# Patient Record
Sex: Male | Born: 1960 | Race: White | Hispanic: No | Marital: Married | Smoking: Never smoker
Health system: Southern US, Community
[De-identification: ages and names within clinical notes are randomized; demographics above are authoritative.]

## PROBLEM LIST (undated history)

## (undated) DIAGNOSIS — T7840XA Allergy, unspecified, initial encounter: Secondary | ICD-10-CM

## (undated) DIAGNOSIS — R0602 Shortness of breath: Secondary | ICD-10-CM

## (undated) DIAGNOSIS — K219 Gastro-esophageal reflux disease without esophagitis: Secondary | ICD-10-CM

## (undated) DIAGNOSIS — Z86010 Personal history of colonic polyps: Principal | ICD-10-CM

## (undated) DIAGNOSIS — M1990 Unspecified osteoarthritis, unspecified site: Secondary | ICD-10-CM

## (undated) DIAGNOSIS — I10 Essential (primary) hypertension: Secondary | ICD-10-CM

## (undated) DIAGNOSIS — J069 Acute upper respiratory infection, unspecified: Secondary | ICD-10-CM

## (undated) DIAGNOSIS — J302 Other seasonal allergic rhinitis: Secondary | ICD-10-CM

## (undated) HISTORY — DX: Essential (primary) hypertension: I10

## (undated) HISTORY — DX: Personal history of colonic polyps: Z86.010

## (undated) HISTORY — DX: Allergy, unspecified, initial encounter: T78.40XA

## (undated) HISTORY — DX: Gastro-esophageal reflux disease without esophagitis: K21.9

## (undated) HISTORY — PX: TONSILLECTOMY: SUR1361

## (undated) HISTORY — PX: OTHER SURGICAL HISTORY: SHX169

## (undated) HISTORY — DX: Shortness of breath: R06.02

---
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
## 2005-04-08 ENCOUNTER — Encounter: Admission: RE | Admit: 2005-04-08 | Discharge: 2005-07-07 | Payer: Self-pay | Admitting: Family Medicine

## 2005-09-22 ENCOUNTER — Encounter: Admission: RE | Admit: 2005-09-22 | Discharge: 2005-12-21 | Payer: Self-pay | Admitting: Family Medicine

## 2007-09-09 HISTORY — PX: KNEE ARTHROTOMY: SUR107

## 2010-08-19 ENCOUNTER — Encounter
Admission: RE | Admit: 2010-08-19 | Discharge: 2010-09-07 | Payer: Self-pay | Source: Home / Self Care | Attending: Sports Medicine | Admitting: Sports Medicine

## 2010-09-08 ENCOUNTER — Encounter
Admission: RE | Admit: 2010-09-08 | Discharge: 2010-10-08 | Payer: Self-pay | Source: Home / Self Care | Attending: Sports Medicine | Admitting: Sports Medicine

## 2011-10-21 ENCOUNTER — Other Ambulatory Visit (HOSPITAL_COMMUNITY): Payer: Self-pay | Admitting: Orthopaedic Surgery

## 2011-11-18 ENCOUNTER — Encounter (HOSPITAL_COMMUNITY): Payer: Self-pay | Admitting: Pharmacy Technician

## 2011-11-20 ENCOUNTER — Encounter (HOSPITAL_COMMUNITY)
Admission: RE | Admit: 2011-11-20 | Discharge: 2011-11-20 | Disposition: A | Discharge status: Home / Self Care | Payer: BC Managed Care – PPO | Source: Ambulatory Visit | Attending: Orthopaedic Surgery | Admitting: Orthopaedic Surgery

## 2011-11-20 ENCOUNTER — Encounter (HOSPITAL_COMMUNITY): Payer: Self-pay

## 2011-11-20 ENCOUNTER — Other Ambulatory Visit: Payer: Self-pay

## 2011-11-20 ENCOUNTER — Ambulatory Visit (HOSPITAL_COMMUNITY)
Admission: RE | Admit: 2011-11-20 | Discharge: 2011-11-20 | Disposition: A | Discharge status: Home / Self Care | Payer: BC Managed Care – PPO | Source: Ambulatory Visit | Attending: Orthopaedic Surgery | Admitting: Orthopaedic Surgery

## 2011-11-20 DIAGNOSIS — Z01810 Encounter for preprocedural cardiovascular examination: Secondary | ICD-10-CM | POA: Insufficient documentation

## 2011-11-20 DIAGNOSIS — I10 Essential (primary) hypertension: Secondary | ICD-10-CM | POA: Insufficient documentation

## 2011-11-20 DIAGNOSIS — Z01818 Encounter for other preprocedural examination: Secondary | ICD-10-CM | POA: Insufficient documentation

## 2011-11-20 DIAGNOSIS — Z01812 Encounter for preprocedural laboratory examination: Secondary | ICD-10-CM | POA: Insufficient documentation

## 2011-11-20 DIAGNOSIS — E119 Type 2 diabetes mellitus without complications: Secondary | ICD-10-CM | POA: Insufficient documentation

## 2011-11-20 HISTORY — DX: Acute upper respiratory infection, unspecified: J06.9

## 2011-11-20 HISTORY — DX: Other seasonal allergic rhinitis: J30.2

## 2011-11-20 HISTORY — DX: Unspecified osteoarthritis, unspecified site: M19.90

## 2011-11-20 LAB — URINALYSIS, ROUTINE W REFLEX MICROSCOPIC
Bilirubin Urine: NEGATIVE
Ketones, ur: NEGATIVE mg/dL
Nitrite: NEGATIVE
Specific Gravity, Urine: 1.028 (ref 1.005–1.030)
Urobilinogen, UA: 0.2 mg/dL (ref 0.0–1.0)

## 2011-11-20 LAB — CBC
MCV: 89 fL (ref 78.0–100.0)
Platelets: 245 10*3/uL (ref 150–400)
RDW: 12.1 % (ref 11.5–15.5)
WBC: 7.3 10*3/uL (ref 4.0–10.5)

## 2011-11-20 LAB — BASIC METABOLIC PANEL
CO2: 27 mEq/L (ref 19–32)
Calcium: 9.5 mg/dL (ref 8.4–10.5)
Creatinine, Ser: 0.61 mg/dL (ref 0.50–1.35)
GFR calc Af Amer: >90 mL/min (ref >90)

## 2011-11-20 LAB — SURGICAL PCR SCREEN: Staphylococcus aureus: NEGATIVE

## 2011-11-20 LAB — URINE MICROSCOPIC-ADD ON

## 2011-11-20 NOTE — Pre-Procedure Instructions (Signed)
Spoke with [place] at Dr Vevelyn Royals office who stated she would send intra office email to Dr Vevelyn Royals assistant to review abnormal urinalysis and glucose

## 2011-11-20 NOTE — Patient Instructions (Signed)
20 DORANCE SPINK  11/20/2011   Your procedure is scheduled on:  11/28/11   Surgery 0730-0900     FRIDAY  Report to [facility] at  0515     AM.  Call this number if you have problems the morning of surgery: [phone]     Or PST   [id number]  [facility]   Remember:   Do not eat food:After Midnight.    Thursday night  May have clear liquids:  Until MIDNIGHT  Thursday night  Clear liquids include soda, tea, black coffee, apple or grape juice, broth.  Take these medicines the morning of surgery with A SIP OF WATER:none   Do not wear jewelry, make-up or nail polish.  Do not wear lotions, powders, or perfumes. You may wear deodorant.  Do not shave 48 hours prior to surgery.  Do not bring valuables to the hospital.  Contacts, dentures or bridgework may not be worn into surgery.  Leave suitcase in the car. After surgery it may be brought to your room.  For patients admitted to the hospital, checkout time is 11:00 AM the day of discharge.   Patients discharged the day of surgery will not be allowed to drive home.  Name and phone number of your driver:       wife                                                               Special Instructions: CHG Shower Use Special Wash: 1/2 bottle night before surgery and 1/2 bottle morning of surgery. REGULAR SOAP FACE  And PRIVATES            MEN-MAY SHAVE FACE MORNING OF SURGERY  Please read over the following fact sheets that you were given: MRSA Information

## 2011-11-20 NOTE — Pre-Procedure Instructions (Signed)
fAXED  STOP BANG SCREENING TOOL TO DR Rosezetta Schlatter.  States doesn't desire information packet regarding total joint replacements.  States BP has not been elevated as is today- states will recheck at home and see PCP if remains elevated, denies headache

## 2011-11-20 NOTE — Progress Notes (Signed)
11/20/11 0904  OBSTRUCTIVE SLEEP APNEA  Have you ever been diagnosed with sleep apnea through a sleep study? No  Do you snore loudly (loud enough to be heard through closed doors)?  1  Do you often feel tired, fatigued, or sleepy during the daytime? 0  Has anyone observed you stop breathing during your sleep? 0  Do you have, or are you being treated for high blood pressure? 1  BMI more than 35 kg/m2? 0  Age over 51 years old? 1  Neck circumference greater than 40 cm/18 inches? 0  Gender: 1  Obstructive Sleep Apnea Score 4   Score 4 or greater  Updated health history;Results sent to PCP

## 2011-11-21 ENCOUNTER — Other Ambulatory Visit (HOSPITAL_COMMUNITY): Payer: BC Managed Care – PPO

## 2011-11-28 ENCOUNTER — Ambulatory Visit (HOSPITAL_COMMUNITY): Payer: BC Managed Care – PPO

## 2011-11-28 ENCOUNTER — Inpatient Hospital Stay (HOSPITAL_COMMUNITY)
Admission: RE | Admit: 2011-11-28 | Discharge: 2011-12-01 | DRG: 818 | Disposition: A | Discharge status: Home Health | Payer: BC Managed Care – PPO | Source: Ambulatory Visit | Attending: Orthopaedic Surgery | Admitting: Orthopaedic Surgery

## 2011-11-28 ENCOUNTER — Ambulatory Visit (HOSPITAL_COMMUNITY): Payer: BC Managed Care – PPO | Admitting: Anesthesiology

## 2011-11-28 ENCOUNTER — Encounter (HOSPITAL_COMMUNITY): Payer: Self-pay | Admitting: *Deleted

## 2011-11-28 ENCOUNTER — Encounter (HOSPITAL_COMMUNITY): Payer: Self-pay | Admitting: Anesthesiology

## 2011-11-28 ENCOUNTER — Encounter (HOSPITAL_COMMUNITY)
Admission: RE | Disposition: A | Discharge status: Home Health | Payer: Self-pay | Source: Ambulatory Visit | Attending: Orthopaedic Surgery

## 2011-11-28 DIAGNOSIS — G4730 Sleep apnea, unspecified: Secondary | ICD-10-CM | POA: Diagnosis present

## 2011-11-28 DIAGNOSIS — E119 Type 2 diabetes mellitus without complications: Secondary | ICD-10-CM | POA: Diagnosis present

## 2011-11-28 DIAGNOSIS — M169 Osteoarthritis of hip, unspecified: Secondary | ICD-10-CM

## 2011-11-28 DIAGNOSIS — I10 Essential (primary) hypertension: Secondary | ICD-10-CM | POA: Diagnosis present

## 2011-11-28 DIAGNOSIS — M1610 Unilateral primary osteoarthritis, unspecified hip: Principal | ICD-10-CM | POA: Diagnosis present

## 2011-11-28 DIAGNOSIS — Z96642 Presence of left artificial hip joint: Secondary | ICD-10-CM

## 2011-11-28 HISTORY — PX: TOTAL HIP ARTHROPLASTY: SHX124

## 2011-11-28 LAB — GLUCOSE, CAPILLARY
Glucose-Capillary: 228 mg/dL — ABNORMAL HIGH (ref 70–99)
Glucose-Capillary: 447 mg/dL — ABNORMAL HIGH (ref 70–99)
Glucose-Capillary: 302 mg/dL — ABNORMAL HIGH (ref 70–99)

## 2011-11-28 LAB — TYPE AND SCREEN
ABO/RH(D): A NEG
Antibody Screen: NEGATIVE

## 2011-11-28 SURGERY — ARTHROPLASTY, HIP, TOTAL, ANTERIOR APPROACH
Anesthesia: General | Site: Hip | Laterality: Left | Wound class: Clean

## 2011-11-28 MED ORDER — INSULIN ASPART 100 UNIT/ML ~~LOC~~ SOLN
0.0000 [IU] | SUBCUTANEOUS | Status: DC
  Administered 2011-11-28: 8 [IU] via SUBCUTANEOUS
  Administered 2011-11-28: 20 [IU] via SUBCUTANEOUS

## 2011-11-28 MED ORDER — INSULIN ASPART 100 UNIT/ML ~~LOC~~ SOLN
0.0000 [IU] | Freq: Three times a day (TID) | SUBCUTANEOUS | Status: DC
  Administered 2011-11-29: 7 [IU] via SUBCUTANEOUS
  Administered 2011-11-29: 11 [IU] via SUBCUTANEOUS
  Administered 2011-11-29: 7 [IU] via SUBCUTANEOUS
  Administered 2011-11-30: 20 [IU] via SUBCUTANEOUS
  Administered 2011-11-30: 7 [IU] via SUBCUTANEOUS
  Administered 2011-11-30 – 2011-12-01 (×2): 4 [IU] via SUBCUTANEOUS

## 2011-11-28 MED ORDER — CEFAZOLIN SODIUM-DEXTROSE 2-3 GM-% IV SOLR: 2.0000 g | INTRAVENOUS | Status: DC

## 2011-11-28 MED ORDER — ROCURONIUM BROMIDE 100 MG/10ML IV SOLN
INTRAVENOUS | Status: DC | PRN
  Administered 2011-11-28: 10 mg via INTRAVENOUS
  Administered 2011-11-28: 30 mg via INTRAVENOUS
  Administered 2011-11-28: 10 mg via INTRAVENOUS
  Administered 2011-11-28: 50 mg via INTRAVENOUS
  Administered 2011-11-28: 10 mg via INTRAVENOUS

## 2011-11-28 MED ORDER — NALOXONE HCL 0.4 MG/ML IJ SOLN: 0.4000 mg | INTRAMUSCULAR | Status: DC | PRN

## 2011-11-28 MED ORDER — METHOCARBAMOL 500 MG PO TABS
500.0000 mg | ORAL_TABLET | Freq: Four times a day (QID) | ORAL | Status: DC | PRN
  Administered 2011-11-29 – 2011-12-01 (×6): 500 mg via ORAL
  Filled 2011-11-28 (×7): qty 1

## 2011-11-28 MED ORDER — INSULIN ASPART 100 UNIT/ML ~~LOC~~ SOLN
SUBCUTANEOUS | Status: AC
  Filled 2011-11-28: qty 3

## 2011-11-28 MED ORDER — HYDROCODONE-ACETAMINOPHEN 5-325 MG PO TABS: 1.0000 | ORAL_TABLET | ORAL | Status: DC | PRN

## 2011-11-28 MED ORDER — INSULIN ASPART 100 UNIT/ML ~~LOC~~ SOLN
4.0000 [IU] | Freq: Once | SUBCUTANEOUS | Status: AC
  Administered 2011-11-28: 4 [IU] via SUBCUTANEOUS

## 2011-11-28 MED ORDER — PROPOFOL 10 MG/ML IV BOLUS
INTRAVENOUS | Status: DC | PRN
  Administered 2011-11-28: 200 mg via INTRAVENOUS

## 2011-11-28 MED ORDER — INSULIN ASPART 100 UNIT/ML ~~LOC~~ SOLN
SUBCUTANEOUS | Status: AC
  Filled 2011-11-28: qty 1

## 2011-11-28 MED ORDER — LIDOCAINE HCL (CARDIAC) 20 MG/ML IV SOLN
INTRAVENOUS | Status: DC | PRN
  Administered 2011-11-28: 50 mg via INTRAVENOUS

## 2011-11-28 MED ORDER — DIPHENHYDRAMINE HCL 50 MG/ML IJ SOLN: 12.5000 mg | Freq: Four times a day (QID) | INTRAMUSCULAR | Status: DC | PRN

## 2011-11-28 MED ORDER — PROMETHAZINE HCL 25 MG/ML IJ SOLN: 6.2500 mg | INTRAMUSCULAR | Status: DC | PRN

## 2011-11-28 MED ORDER — DEXAMETHASONE SODIUM PHOSPHATE 10 MG/ML IJ SOLN
INTRAMUSCULAR | Status: DC | PRN
  Administered 2011-11-28: 10 mg via INTRAVENOUS

## 2011-11-28 MED ORDER — CEFAZOLIN SODIUM 1-5 GM-% IV SOLN
1.0000 g | Freq: Four times a day (QID) | INTRAVENOUS | Status: AC
  Administered 2011-11-28 – 2011-11-29 (×3): 1 g via INTRAVENOUS
  Filled 2011-11-28 (×3): qty 50

## 2011-11-28 MED ORDER — HYDROMORPHONE HCL PF 1 MG/ML IJ SOLN
INTRAMUSCULAR | Status: DC | PRN
  Administered 2011-11-28 (×4): 0.5 mg via INTRAVENOUS

## 2011-11-28 MED ORDER — DIPHENHYDRAMINE HCL 12.5 MG/5ML PO ELIX
12.5000 mg | ORAL_SOLUTION | Freq: Four times a day (QID) | ORAL | Status: DC | PRN
  Filled 2011-11-28: qty 5

## 2011-11-28 MED ORDER — MENTHOL 3 MG MT LOZG
1.0000 | LOZENGE | OROMUCOSAL | Status: DC | PRN
  Filled 2011-11-28: qty 9

## 2011-11-28 MED ORDER — SODIUM CHLORIDE 0.9 % IJ SOLN: 9.0000 mL | INTRAMUSCULAR | Status: DC | PRN

## 2011-11-28 MED ORDER — METOCLOPRAMIDE HCL 5 MG/ML IJ SOLN: 5.0000 mg | Freq: Three times a day (TID) | INTRAMUSCULAR | Status: DC | PRN

## 2011-11-28 MED ORDER — LACTATED RINGERS IV SOLN: INTRAVENOUS | Status: DC

## 2011-11-28 MED ORDER — MORPHINE SULFATE (PF) 1 MG/ML IV SOLN
INTRAVENOUS | Status: DC
  Administered 2011-11-28: 1 mg via INTRAVENOUS
  Administered 2011-11-28: 8 mg via INTRAVENOUS
  Administered 2011-11-28: 2 mg via INTRAVENOUS
  Administered 2011-11-29: 3 mg via INTRAVENOUS
  Administered 2011-11-29: 2 mg via INTRAVENOUS
  Administered 2011-11-29: 5 mg via INTRAVENOUS

## 2011-11-28 MED ORDER — DIPHENHYDRAMINE HCL 12.5 MG/5ML PO ELIX: 12.5000 mg | ORAL_SOLUTION | ORAL | Status: DC | PRN

## 2011-11-28 MED ORDER — HETASTARCH-ELECTROLYTES 6 % IV SOLN
INTRAVENOUS | Status: DC | PRN
  Administered 2011-11-28: 09:00:00 via INTRAVENOUS

## 2011-11-28 MED ORDER — FENTANYL CITRATE 0.05 MG/ML IJ SOLN
INTRAMUSCULAR | Status: DC | PRN
  Administered 2011-11-28 (×7): 50 ug via INTRAVENOUS

## 2011-11-28 MED ORDER — PHENOL 1.4 % MT LIQD
1.0000 | OROMUCOSAL | Status: DC | PRN
  Filled 2011-11-28: qty 177

## 2011-11-28 MED ORDER — METOCLOPRAMIDE HCL 10 MG PO TABS: 5.0000 mg | ORAL_TABLET | Freq: Three times a day (TID) | ORAL | Status: DC | PRN

## 2011-11-28 MED ORDER — FERROUS SULFATE 325 (65 FE) MG PO TABS
325.0000 mg | ORAL_TABLET | Freq: Three times a day (TID) | ORAL | Status: DC
  Administered 2011-11-28 – 2011-12-01 (×9): 325 mg via ORAL
  Filled 2011-11-28 (×6): qty 1
  Filled 2011-11-28: qty 2
  Filled 2011-11-28: qty 1

## 2011-11-28 MED ORDER — SODIUM CHLORIDE 0.9 % IR SOLN
Status: DC | PRN
  Administered 2011-11-28: 1000 mL

## 2011-11-28 MED ORDER — RIVAROXABAN 10 MG PO TABS
10.0000 mg | ORAL_TABLET | Freq: Every day | ORAL | Status: DC
  Administered 2011-11-29 – 2011-12-01 (×3): 10 mg via ORAL
  Filled 2011-11-28 (×3): qty 1

## 2011-11-28 MED ORDER — ACETAMINOPHEN 325 MG PO TABS: 650.0000 mg | ORAL_TABLET | Freq: Four times a day (QID) | ORAL | Status: DC | PRN

## 2011-11-28 MED ORDER — MORPHINE SULFATE (PF) 1 MG/ML IV SOLN
INTRAVENOUS | Status: AC
  Filled 2011-11-28: qty 25

## 2011-11-28 MED ORDER — ALUM & MAG HYDROXIDE-SIMETH 200-200-20 MG/5ML PO SUSP: 30.0000 mL | ORAL | Status: DC | PRN

## 2011-11-28 MED ORDER — NEOSTIGMINE METHYLSULFATE 1 MG/ML IJ SOLN
INTRAMUSCULAR | Status: DC | PRN
  Administered 2011-11-28: 5 mg via INTRAVENOUS

## 2011-11-28 MED ORDER — DOCUSATE SODIUM 100 MG PO CAPS
100.0000 mg | ORAL_CAPSULE | Freq: Two times a day (BID) | ORAL | Status: DC
  Administered 2011-11-28 – 2011-12-01 (×7): 100 mg via ORAL
  Filled 2011-11-28 (×6): qty 1

## 2011-11-28 MED ORDER — MIDAZOLAM HCL 5 MG/5ML IJ SOLN
INTRAMUSCULAR | Status: DC | PRN
  Administered 2011-11-28: 2 mg via INTRAVENOUS

## 2011-11-28 MED ORDER — FENTANYL CITRATE 0.05 MG/ML IJ SOLN
INTRAMUSCULAR | Status: AC
  Filled 2011-11-28: qty 2

## 2011-11-28 MED ORDER — EPHEDRINE SULFATE 50 MG/ML IJ SOLN
INTRAMUSCULAR | Status: DC | PRN
  Administered 2011-11-28: 10 mg via INTRAVENOUS
  Administered 2011-11-28: 5 mg via INTRAVENOUS

## 2011-11-28 MED ORDER — ACETAMINOPHEN 10 MG/ML IV SOLN
INTRAVENOUS | Status: DC | PRN
  Administered 2011-11-28: 1000 mg via INTRAVENOUS

## 2011-11-28 MED ORDER — ONDANSETRON HCL 4 MG/2ML IJ SOLN
INTRAMUSCULAR | Status: DC | PRN
  Administered 2011-11-28: 4 mg via INTRAVENOUS

## 2011-11-28 MED ORDER — ONDANSETRON HCL 4 MG/2ML IJ SOLN: 4.0000 mg | Freq: Four times a day (QID) | INTRAMUSCULAR | Status: DC | PRN

## 2011-11-28 MED ORDER — METHOCARBAMOL 100 MG/ML IJ SOLN
500.0000 mg | Freq: Four times a day (QID) | INTRAVENOUS | Status: DC | PRN
  Administered 2011-11-28: 500 mg via INTRAVENOUS
  Filled 2011-11-28: qty 5

## 2011-11-28 MED ORDER — ONDANSETRON HCL 4 MG PO TABS: 4.0000 mg | ORAL_TABLET | Freq: Four times a day (QID) | ORAL | Status: DC | PRN

## 2011-11-28 MED ORDER — METOCLOPRAMIDE HCL 5 MG/ML IJ SOLN
INTRAMUSCULAR | Status: DC | PRN
  Administered 2011-11-28: 10 mg via INTRAVENOUS

## 2011-11-28 MED ORDER — ACETAMINOPHEN 650 MG RE SUPP: 650.0000 mg | Freq: Four times a day (QID) | RECTAL | Status: DC | PRN

## 2011-11-28 MED ORDER — CEFAZOLIN SODIUM 1-5 GM-% IV SOLN
INTRAVENOUS | Status: DC | PRN
  Administered 2011-11-28: 2 g via INTRAVENOUS

## 2011-11-28 MED ORDER — ZOLPIDEM TARTRATE 5 MG PO TABS: 5.0000 mg | ORAL_TABLET | Freq: Every evening | ORAL | Status: DC | PRN

## 2011-11-28 MED ORDER — LACTATED RINGERS IV SOLN
INTRAVENOUS | Status: DC | PRN
  Administered 2011-11-28 (×3): via INTRAVENOUS

## 2011-11-28 MED ORDER — SODIUM CHLORIDE 0.9 % IV SOLN
INTRAVENOUS | Status: DC
  Administered 2011-11-28: 20:00:00 via INTRAVENOUS
  Administered 2011-11-29: 1000 mL via INTRAVENOUS

## 2011-11-28 MED ORDER — MORPHINE SULFATE 2 MG/ML IJ SOLN: 1.0000 mg | INTRAMUSCULAR | Status: DC | PRN

## 2011-11-28 MED ORDER — GLYCOPYRROLATE 0.2 MG/ML IJ SOLN
INTRAMUSCULAR | Status: DC | PRN
  Administered 2011-11-28: .5 mg via INTRAVENOUS

## 2011-11-28 MED ORDER — OXYCODONE HCL 5 MG PO TABS
5.0000 mg | ORAL_TABLET | ORAL | Status: DC | PRN
  Administered 2011-11-29 – 2011-12-01 (×6): 10 mg via ORAL
  Filled 2011-11-28 (×6): qty 2

## 2011-11-28 MED ORDER — INSULIN ASPART 100 UNIT/ML ~~LOC~~ SOLN
0.0000 [IU] | Freq: Every day | SUBCUTANEOUS | Status: DC
  Administered 2011-11-28: 4 [IU] via SUBCUTANEOUS
  Administered 2011-11-29: 3 [IU] via SUBCUTANEOUS
  Administered 2011-11-30: 2 [IU] via SUBCUTANEOUS

## 2011-11-28 MED ORDER — FENTANYL CITRATE 0.05 MG/ML IJ SOLN
25.0000 ug | INTRAMUSCULAR | Status: DC | PRN
  Administered 2011-11-28: 25 ug via INTRAVENOUS
  Administered 2011-11-28: 50 ug via INTRAVENOUS
  Administered 2011-11-28: 25 ug via INTRAVENOUS

## 2011-11-28 SURGICAL SUPPLY — 36 items
BAG SPEC THK2 15X12 ZIP CLS (MISCELLANEOUS) ×2
BAG ZIPLOCK 12X15 (MISCELLANEOUS) ×4 IMPLANT
BLADE SAW SGTL 18X1.27X75 (BLADE) ×2 IMPLANT
CELLS DAT CNTRL 66122 CELL SVR (MISCELLANEOUS) ×1 IMPLANT
CLOTH BEACON ORANGE TIMEOUT ST (SAFETY) ×2 IMPLANT
DRAPE C-ARM 42X72 X-RAY (DRAPES) ×2 IMPLANT
DRAPE STERI IOBAN 125X83 (DRAPES) ×2 IMPLANT
DRAPE U-SHAPE 47X51 STRL (DRAPES) ×6 IMPLANT
DRSG MEPILEX BORDER 4X8 (GAUZE/BANDAGES/DRESSINGS) ×2 IMPLANT
DURAPREP 26ML APPLICATOR (WOUND CARE) ×2 IMPLANT
ELECT BLADE TIP CTD 4 INCH (ELECTRODE) ×2 IMPLANT
ELECT REM PT RETURN 9FT ADLT (ELECTROSURGICAL) ×2
ELECTRODE REM PT RTRN 9FT ADLT (ELECTROSURGICAL) ×1 IMPLANT
FACESHIELD LNG OPTICON STERILE (SAFETY) ×10 IMPLANT
GAUZE XEROFORM 1X8 LF (GAUZE/BANDAGES/DRESSINGS) ×2 IMPLANT
GLOVE BIO SURGEON STRL SZ7 (GLOVE) ×2 IMPLANT
GLOVE BIO SURGEON STRL SZ7.5 (GLOVE) ×2 IMPLANT
GLOVE BIOGEL PI IND STRL 7.5 (GLOVE) IMPLANT
GLOVE BIOGEL PI IND STRL 8 (GLOVE) ×1 IMPLANT
GLOVE BIOGEL PI INDICATOR 7.5 (GLOVE)
GLOVE BIOGEL PI INDICATOR 8 (GLOVE) ×1
GLOVE ECLIPSE 7.0 STRL STRAW (GLOVE) ×2 IMPLANT
GOWN STRL REIN XL XLG (GOWN DISPOSABLE) ×7 IMPLANT
KIT BASIN OR (CUSTOM PROCEDURE TRAY) ×2 IMPLANT
PACK TOTAL JOINT (CUSTOM PROCEDURE TRAY) ×2 IMPLANT
PADDING CAST COTTON 6X4 STRL (CAST SUPPLIES) ×2 IMPLANT
RETRACTOR WND ALEXIS 18 MED (MISCELLANEOUS) ×1 IMPLANT
RTRCTR WOUND ALEXIS 18CM MED (MISCELLANEOUS) ×2
STAPLER SKIN PROX WIDE 3.9 (STAPLE) ×1 IMPLANT
SUT ETHIBOND NAB CT1 #1 30IN (SUTURE) ×3 IMPLANT
SUT VIC AB 1 CT1 36 (SUTURE) ×4 IMPLANT
SUT VIC AB 2-0 CT1 27 (SUTURE) ×2
SUT VIC AB 2-0 CT1 TAPERPNT 27 (SUTURE) ×2 IMPLANT
TOWEL OR 17X26 10 PK STRL BLUE (TOWEL DISPOSABLE) ×4 IMPLANT
TOWEL OR NON WOVEN STRL DISP B (DISPOSABLE) ×2 IMPLANT
TRAY FOLEY CATH 14FRSI W/METER (CATHETERS) ×2 IMPLANT

## 2011-11-28 NOTE — H&P (Signed)
Adrian Tucker is an 51 y.o. male.   Chief Complaint:   Severe left hip pain HPI:   Known severe end-stage OA of left hip.  Greatly affects ADL's.  Wishes to proceed with a left total hip replacement.  Understands the risks of blood loss, nerve injury, fracture, DVT, and PE.  The goals are improved function and decreased.  Past Medical History  Diagnosis Date  . Recurrent upper respiratory infection (URI)     chest congestion 2-3 weeks ago- cough, no fever- states improved  . Arthritis   . Hypertension      stress test 2009- reports neg- unsure testing site/lost 60 lbs 2 yrs ago and is off meds now  . Seasonal allergies   . Sleep apnea     STOP BANG SCORE 4  . Diabetes mellitus     Past Surgical History  Procedure Date  . Tonsillectomy   . Knee arthrotomy 2009    left    History reviewed. No pertinent family history. Social History:  reports that he has never smoked. He has never used smokeless tobacco. He reports that he drinks alcohol. He reports that he does not use illicit drugs.  Allergies: No Known Allergies  Medications Prior to Admission  Medication Dose Route Frequency Provider Last Rate Last Dose  . ceFAZolin (ANCEF) IVPB 2 g/50 mL premix  2 g Intravenous 60 min Pre-Op Kathryne Hitch, MD      . insulin aspart (novoLOG) injection 4 Units  4 Units Subcutaneous Once Eilene Ghazi, MD   4 Units at 11/28/11 [phone]  . DISCONTD: insulin aspart (novoLOG) 100 UNIT/ML injection            No current outpatient prescriptions on file as of 11/28/2011.    Results for orders placed during the hospital encounter of 11/28/11 (from the past 48 hour(s))  TYPE AND SCREEN     Status: Normal   Collection Time   11/28/11  5:45 AM      Component Value Range Comment   ABO/RH(D) A NEG      Antibody Screen NEG      Sample Expiration 12/01/2011     ABO/RH     Status: Normal   Collection Time   11/28/11  5:45 AM      Component Value Range Comment   ABO/RH(D) A NEG     GLUCOSE,  CAPILLARY     Status: Abnormal   Collection Time   11/28/11  5:48 AM      Component Value Range Comment   Glucose-Capillary 234 (*) 70 - 99 (mg/dL)    Comment 1 Documented in Chart      No results found.  Review of Systems  All other systems reviewed and are negative.    Blood pressure 187/91, pulse 68, temperature 97 F (36.1 C), temperature source Oral, resp. rate 18, SpO2 97.00%. Physical Exam  Constitutional: He is oriented to person, place, and time. He appears well-developed and well-nourished.  HENT:  Head: Normocephalic and atraumatic.  Eyes: EOM are normal. Pupils are equal, round, and reactive to light.  Neck: Normal range of motion. Neck supple.  Cardiovascular: Normal rate and regular rhythm.   Respiratory: Effort normal and breath sounds normal.  GI: Soft. Bowel sounds are normal.  Musculoskeletal:       Left hip: He exhibits decreased range of motion, tenderness and crepitus.  Neurological: He is alert and oriented to person, place, and time.  Skin: Skin is warm and dry.  Psychiatric: He has a normal mood and affect.     Assessment/Plan To the OR today for a left total hip replacement then admission as an inpatient.  Landree Fernholz Y 11/28/2011, 7:06 AM

## 2011-11-28 NOTE — Preoperative (Signed)
Beta Blockers   Reason not to administer Beta Blockers:Not Applicable 

## 2011-11-28 NOTE — H&P (Signed)
  I did see and examine Adrian Tucker at the bedside.  He is doing well and ready for surgery.  There has been no changes since he was seen int the office.  We will proceed with a left total hip replacement today.

## 2011-11-28 NOTE — Transfer of Care (Signed)
Immediate Anesthesia Transfer of Care Note  Patient: Adrian Tucker  Procedure(s) Performed: Procedure(s) (LRB): TOTAL HIP ARTHROPLASTY ANTERIOR APPROACH (Left)  Patient Location: PACU  Anesthesia Type: General  Level of Consciousness: awake, alert , patient cooperative and responds to stimulation  Airway & Oxygen Therapy: Patient Spontanous Breathing and Patient connected to face mask oxygen  Post-op Assessment: Report given to PACU RN, Post -op Vital signs reviewed and stable and Patient moving all extremities X 4  Post vital signs: Reviewed and stable  Complications: No apparent anesthesia complications

## 2011-11-28 NOTE — Anesthesia Postprocedure Evaluation (Signed)
Anesthesia Post Note  Patient: Adrian Tucker  Procedure(s) Performed: Procedure(s) (LRB): TOTAL HIP ARTHROPLASTY ANTERIOR APPROACH (Left)  Anesthesia type: General  Patient location: PACU  Post pain: Pain level controlled  Post assessment: Post-op Vital signs reviewed  Last Vitals:  Filed Vitals:   11/28/11 1048  BP:   Pulse:   Temp:   Resp: 12    Post vital signs: Reviewed  Level of consciousness: sedated  Complications: No apparent anesthesia complications

## 2011-11-28 NOTE — Anesthesia Procedure Notes (Signed)
Procedure Name: Intubation Date/Time: 11/28/2011 7:39 AM Performed by: Randon Goldsmith CATHERINE PAYNE Pre-anesthesia Checklist: Patient identified, Emergency Drugs available, Patient being monitored and Suction available Patient Re-evaluated:Patient Re-evaluated prior to inductionOxygen Delivery Method: Circle system utilized Preoxygenation: Pre-oxygenation with 100% oxygen Intubation Type: IV induction Ventilation: Mask ventilation without difficulty Laryngoscope Size: Mac and 4 Grade View: Grade III Tube type: Oral Tube size: 7.5 mm Number of attempts: 2 Airway Equipment and Method: Bougie stylet Placement Confirmation: positive ETCO2 and breath sounds checked- equal and bilateral Secured at: 22 cm Tube secured with: Tape Dental Injury: Teeth and Oropharynx as per pre-operative assessment  Difficulty Due To: Difficulty was anticipated

## 2011-11-28 NOTE — Brief Op Note (Signed)
11/28/2011  9:38 AM  PATIENT:  Marlowe Shores  51 y.o. male  PRE-OPERATIVE DIAGNOSIS:  severe osteoarthritis left hip  POST-OPERATIVE DIAGNOSIS:  severe osteoarthritis left hip  PROCEDURE:  Procedure(s) (LRB): TOTAL HIP ARTHROPLASTY ANTERIOR APPROACH (Left)  SURGEON:  Surgeon(s) and Role:    * Kathryne Hitch, MD - Primary    * Eldred Manges, MD - Assisting  PHYSICIAN ASSISTANT:   ASSISTANTS: none   ANESTHESIA:   general  EBL:  Total I/O In: 2000 [I.V.:2000] Out: 1175 [Urine:300; Blood:875]  BLOOD ADMINISTERED:none  DRAINS: none   LOCAL MEDICATIONS USED:  NONE  SPECIMEN:  No Specimen  DISPOSITION OF SPECIMEN:  N/A  COUNTS:  YES  TOURNIQUET:  * No tourniquets in log *  DICTATION: .Other Dictation: Dictation Number [id number]  PLAN OF CARE: Admit to inpatient   PATIENT DISPOSITION:  PACU - hemodynamically stable.   Delay start of Pharmacological VTE agent (>24hrs) due to surgical blood loss or risk of bleeding: not applicable

## 2011-11-28 NOTE — Anesthesia Preprocedure Evaluation (Addendum)
Anesthesia Evaluation  Patient identified by MRN, date of birth, ID band Patient awake    Reviewed: Allergy & Precautions, H&P , NPO status , Patient's Chart, lab work & pertinent test results  History of Anesthesia Complications Negative for: history of anesthetic complications  Airway Mallampati: II TM Distance: <3 FB Neck ROM: Full    Dental  (+) Teeth Intact and Dental Advisory Given   Pulmonary neg pulmonary ROS, sleep apnea , Recent URI , Resolved,  breath sounds clear to auscultation  Pulmonary exam normal       Cardiovascular hypertension (Patient has been noncompiant with medication), Rhythm:Regular Rate:Normal     Neuro/Psych negative neurological ROS  negative psych ROS   GI/Hepatic negative GI ROS, Neg liver ROS,   Endo/Other  Diabetes mellitus- (Patient noncompliant with medication), Poorly Controlled, Type 2  Renal/GU negative Renal ROS  negative genitourinary   Musculoskeletal negative musculoskeletal ROS (+)   Abdominal   Peds negative pediatric ROS (+)  Hematology negative hematology ROS (+)   Anesthesia Other Findings   Reproductive/Obstetrics negative OB ROS                         Anesthesia Physical Anesthesia Plan  ASA: II  Anesthesia Plan: General   Post-op Pain Management:    Induction: Intravenous  Airway Management Planned: Oral ETT  Additional Equipment:   Intra-op Plan:   Post-operative Plan: Extubation in OR  Informed Consent: I have reviewed the patients History and Physical, chart, labs and discussed the procedure including the risks, benefits and alternatives for the proposed anesthesia with the patient or authorized representative who has indicated his/her understanding and acceptance.   Dental advisory given  Plan Discussed with: CRNA  Anesthesia Plan Comments:         Anesthesia Quick Evaluation

## 2011-11-28 NOTE — Op Note (Signed)
NAMEPRATHIK, AMAN               ACCOUNT NO.:  [id number]  MEDICAL RECORD NO.:  [id number]  LOCATION:  1620                         FACILITY:  [facility]  PHYSICIAN:  Vanita Panda. Magnus Ivan, M.D.DATE OF BIRTH:  [birth year]  DATE OF PROCEDURE:  11/28/2011 DATE OF DISCHARGE:                              OPERATIVE REPORT   PREOPERATIVE DIAGNOSIS:  Severe end-stage arthritis, left hip.  POSTOPERATIVE DIAGNOSIS:  Severe end-stage arthritis, left hip.  PROCEDURE PERFORMED:  Left total hip arthroplasty, direct anterior approach.  IMPLANTS:  DePuy Sector Gription acetabular component size 54, size 36+ 4 neutral polyethylene liner; Corail femoral component size 9 with standard offset, size 36+ 5 ceramic hip ball.  SURGEON:  Vanita Panda. Magnus Ivan, M.D.  ASSISTANTS: 1. Veverly Fells. Ophelia Charter, M.D. 2. Wende Neighbors, P.A.  ANESTHESIA:  General.  BLOOD LOSS:  Less than 800 cc.  COMPLICATIONS:  None.  INDICATIONS:  Adrian Tucker is a 51 year old active individual with well- documented end-stage arthritis of his left hip.  He has bone-on-bone wear confirmed by x-rays.  It hurts on a daily basis and has gotten quite severe in terms of his pain.  He wishes at this point, to proceed with a total hip arthroplasty.  The risks and benefits of this were explained in detail.  He does wish to proceed with this direct anterior plate.  He understands the risks of acute blood loss anemia, DVT, PE, fracture, dislocation among his risks.  The goals are increased mobility, decreased pain, and increased quality of life.  DESCRIPTION OF PROCEDURE:  After informed consent was obtained, the appropriate left hip was marked.  He was brought to the operating room and while he was on the stretcher, general anesthesia was obtained. Foley catheter was placed and traction boots were placed on his feet. He was then placed supine on the Hana fracture table, with a perineal post in place and both feet in boots and with  skeletal traction, with no traction applied.  His left hip was then prepped and draped with DuraPrep and sterile drapes.  Time-out was called to identify the correct patient, correct left hip.  I then made an incision just inferior and posterior to the anterior superior iliac spine and carried this obliquely down the leg.  I dissected down to the tensor fascia lata, and placed a soft tissue protector into the leg and the skin.  I did divide the skin.  I divided the tensor fascia lata obliquely and I proceeded with a direct anterior approach to the hip.  Cobra retractor was placed around the lateral neck and then one medially up underneath the rectus femoris.  I cauterized the lateral femoral circumflex vessels and then opened up the hip capsule from lateral down the leg, and then wedge medial.  I put the Cobra retractors within the capsule.  I then made my femoral neck cut with the oscillating saw, just above the lesser trochanter.  I next used a corkscrew to get the femoral head and removed the femoral head in its entirety.  I cleaned the acetabulum of debris and then placed a bent Hohmann medially and a Cobb retractor laterally. I began reaming from a size 44 reamer  in 2 mm increments up all the way to a 54 reamer with the last two reamers placed under direct fluoroscopic guidance in order to obtain my depth, inclination and anteversion.  Once I was pleased with my depth of resection, I then placed a real size 54 acetabular component with Gription, and knocked this into place under direct visualization and fluoroscopy.  I then placed a hole eliminator guide as well as a 36+ 4 neutral polyethylene liner.  All traction was then off the bed.  I had the leg externally rotated to 90 degrees and placed a temporary hook underneath the vastus ridge.  We extended an adductor of the hip to allow access to the femoral canal, placing a Mueller retractor medially and along the Hohmann underneath the  greater trochanter, where I released tissue and brought the leg up a little higher.  I then used a rongeur and a box cutting guide to gain access to the femoral canal.  I began broaching from just a size 8 to a size 9 broach.  As he was young, he had very thick cortices.  I then trialed a 36+ 1.5 head and a 36+ 5 head, and his leg lengths were equal.  We brought the leg back over and up, and reduced the hip to verify this under direct fluoroscopic guidance. There was minimal shuck.  He was stable with internal and external rotation as well.  I then removed all trial components and placed the real size 9 femoral component, Corail, with a standard offset and collar.  I then placed the real 36+ 5 ceramic femoral head and reduced this in the acetabulum, and again it was stable.  We copiously irrigated the soft tissue with normal saline solution.  I closed the joint capsule with interrupted #1 Ethibond suture followed by running #1 Vicryl in the tensor fascia lata, 2-0 Vicryl in the subcutaneous tissue, and staples on the skin.  Xeroform followed by well-padded sterile dressing was applied.  He was taken off the [facility] table.  His leg lengths were equal. He was awakened, extubated, and taken to recovery room in stable condition.  All final counts were correct.  There were no complications noted.     Vanita Panda. Magnus Ivan, M.D.     CYB/MEDQ  D:  11/28/2011  T:  11/28/2011  Job:  [id number]

## 2011-11-29 LAB — BASIC METABOLIC PANEL
GFR calc Af Amer: >90 mL/min (ref >90)
GFR calc non Af Amer: >90 mL/min (ref >90)
Potassium: 4.4 mEq/L (ref 3.5–5.1)
Sodium: 133 mEq/L — ABNORMAL LOW (ref 135–145)

## 2011-11-29 LAB — CBC
Hemoglobin: 9.9 g/dL — ABNORMAL LOW (ref 13.0–17.0)
MCHC: 35.2 g/dL (ref 30.0–36.0)
Platelets: 162 10*3/uL (ref 150–400)
RDW: 12.2 % (ref 11.5–15.5)

## 2011-11-29 LAB — GLUCOSE, CAPILLARY
Glucose-Capillary: 210 mg/dL — ABNORMAL HIGH (ref 70–99)
Glucose-Capillary: 226 mg/dL — ABNORMAL HIGH (ref 70–99)
Glucose-Capillary: 259 mg/dL — ABNORMAL HIGH (ref 70–99)
Glucose-Capillary: 295 mg/dL — ABNORMAL HIGH (ref 70–99)

## 2011-11-29 NOTE — Evaluation (Signed)
Occupational Therapy Evaluation Patient Details Name: Adrian Tucker MRN: [id number] DOB: October 06, 1960 Today's Date: 11/29/2011  Problem List:  Patient Active Problem List  Diagnoses  . Degenerative arthritis of hip    Past Medical History:  Past Medical History  Diagnosis Date  . Recurrent upper respiratory infection (URI)     chest congestion 2-3 weeks ago- cough, no fever- states improved  . Arthritis   . Hypertension      stress test 2009- reports neg- unsure testing site/lost 60 lbs 2 yrs ago and is off meds now  . Seasonal allergies   . Sleep apnea     STOP BANG SCORE 4  . Diabetes mellitus    Past Surgical History:  Past Surgical History  Procedure Date  . Tonsillectomy   . Knee arthrotomy 2009    left    OT Assessment/Plan/Recommendation OT Assessment Clinical Impression Statement: Pt doing very well POD#1 LTHR anterior. All education completed. Pt will have necessary level of A from family upon d/c. Pt has no DME needs at this time. OT Recommendation/Assessment: Patient does not need any further OT services OT Recommendation Follow Up Recommendations: No OT follow up Equipment Recommended: None recommended by OT Individuals Consulted Consulted and Agree with Results and Recommendations: Patient;Family member/caregiver OT Goals    OT Evaluation Precautions/Restrictions  Precautions Required Braces or Orthoses: No Restrictions Weight Bearing Restrictions: No Prior Functioning Home Living Lives With: Spouse Receives Help From: Family Type of Home: House Home Layout: One level Home Access: Stairs to enter Entrance Stairs-Rails: Can reach both;Right;Left Entrance Stairs-Number of Steps: 4 Bathroom Shower/Tub: Naval architect Equipment: Crutches;Straight cane;Walker - rolling Prior Function Level of Independence: Independent with basic ADLs;Independent with transfers;Independent with homemaking with ambulation;Independent with  gait Driving: Yes Vocation: Full time employment ADL ADL Grooming: Simulated;Supervision/safety Where Assessed - Grooming: Standing at sink Upper Body Bathing: Simulated;Supervision/safety Where Assessed - Upper Body Bathing: Standing at sink Lower Body Bathing: Simulated;Minimal assistance Where Assessed - Lower Body Bathing: Sit to stand from bed Upper Body Dressing: Simulated;Supervision/safety Where Assessed - Upper Body Dressing: Standing Lower Body Dressing: Simulated;Minimal assistance Where Assessed - Lower Body Dressing: Sit to stand from bed Toilet Transfer: Not assessed (Pt stated he had no difficulty standing from toilet earlier.) Toileting - Clothing Manipulation: Simulated;Supervision/safety Where Assessed - Toileting Clothing Manipulation: Standing Toileting - Hygiene: Simulated;Supervision/safety Where Assessed - Toileting Hygiene: Standing Tub/Shower Transfer: Supervision/safety Tub/Shower Transfer Method: Science writer: Walk in shower Vision/Perception    Cognition Cognition Arousal/Alertness: Awake/alert Overall Cognitive Status: Appears within functional limits for tasks assessed Orientation Level: Oriented X4 Sensation/Coordination   Extremity Assessment RUE Assessment RUE Assessment: Within Functional Limits LUE Assessment LUE Assessment: Within Functional Limits Mobility  Bed Mobility Bed Mobility: Yes Supine to Sit: 4: Min assist;HOB elevated (Comment degrees);With rails Supine to Sit Details (indicate cue type and reason): for LLE Transfers Sit to Stand: 5: Supervision;From elevated surface;With upper extremity assist;From bed Exercises   End of Session OT - End of Session Activity Tolerance: Patient tolerated treatment well Patient left: Other (comment) (ambulating with PT) General Behavior During Session: [facility] for tasks performed Cognition: [facility] for tasks performed   Julietta Batterman A, OTR/L  [phone] 11/29/2011, 1:45 PM

## 2011-11-29 NOTE — Progress Notes (Signed)
Physical Therapy Treatment Patient Details Name: Adrian Tucker MRN: [id number] DOB: [birth year] Today's Date: 11/29/2011  PT Assessment/Plan  PT - Assessment/Plan Comments on Treatment Session: Pt did well.  Stair training tomorrow. PT Plan: Discharge plan remains appropriate;Frequency remains appropriate PT Frequency: 7X/week Follow Up Recommendations: Home health PT Equipment Recommended: None recommended by PT PT Goals  Acute Rehab PT Goals PT Goal: Supine/Side to Sit - Progress: Progressing toward goal PT Goal: Sit to Supine/Side - Progress: Progressing toward goal PT Goal: Sit to Stand - Progress: Progressing toward goal PT Goal: Stand to Sit - Progress: Progressing toward goal PT Goal: Ambulate - Progress: Progressing toward goal PT Goal: Perform Home Exercise Program - Progress: Progressing toward goal  PT Treatment Precautions/Restrictions  Precautions Required Braces or Orthoses: No Restrictions Weight Bearing Restrictions: No Mobility (including Balance) Bed Mobility Bed Mobility: Yes Supine to Sit: 4: Min assist Supine to Sit Details (indicate cue type and reason): A for LE Transfers Sit to Stand: 5: Supervision;From elevated surface;With upper extremity assist;From bed Stand to Sit: 5: Supervision;With upper extremity assist;To bed Ambulation/Gait Ambulation/Gait Assistance: 5: Supervision Ambulation/Gait Assistance Details (indicate cue type and reason): Pt with some impulsivity at times.  Antalgic gait pattern. Ambulation Distance (Feet): 400 Feet Assistive device: Rolling walker Gait Pattern: Step-through pattern;Antalgic    Exercise  Total Joint Exercises Ankle Circles/Pumps: AROM;Strengthening;Both;10 reps;Supine Gluteal Sets: Strengthening;Both;10 reps;Supine Short Arc Quad: Strengthening;Left;10 reps;Supine Heel Slides: AAROM;Left;10 reps;Supine Hip ABduction/ADduction: Strengthening;Left;AAROM End of Session PT - End of Session Equipment  Utilized During Treatment: Gait belt Activity Tolerance: Patient tolerated treatment well Patient left: in bed;with family/visitor present Nurse Communication: Other (comment) (Request for muscle relaxer) General Behavior During Session: [facility] for tasks performed Cognition: [facility] for tasks performed  [facility] LUBECK 11/29/2011, 2:40 PM

## 2011-11-29 NOTE — Progress Notes (Signed)
Subjective: 1 Day Post-Op Procedure(s) (LRB): TOTAL HIP ARTHROPLASTY ANTERIOR APPROACH (Left) Patient reports pain as mild.    Objective: Vital signs in last 24 hours: Temp:  [97.6 F (36.4 C)-99 F (37.2 C)] 98.5 F (36.9 C) (03/23 0634) Pulse Rate:  [49-82] 52  (03/23 0634) Resp:  [12-16] 16  (03/23 0821) BP: (111-145)/(67-88) 120/70 mmHg (03/23 0634) SpO2:  [97 %-100 %] 98 % (03/23 0821) Weight:  [98.431 kg (217 lb)] 98.431 kg (217 lb) (03/22 1125)  Intake/Output from previous day: 03/22 0701 - 03/23 0700 In: 4237.5 [P.O.:480; I.V.:3157.5; IV Piggyback:600] Out: 4775 [Urine:3900; Blood:875] Intake/Output this shift:     Basename 11/29/11 0515  HGB 9.9*    Basename 11/29/11 0515  WBC 9.6  RBC 3.13*  HCT 28.1*  PLT 162    Basename 11/29/11 0515 11/28/11 1810  NA 133* --  K 4.4 --  CL 100 --  CO2 28 --  BUN 18 --  CREATININE 0.74 --  GLUCOSE 246* 400*  CALCIUM 8.8 --   No results found for this basename: LABPT:2,INR:2 in the last 72 hours  Sensation intact distally Intact pulses distally Dorsiflexion/Plantar flexion intact Incision: dressing C/D/I  Assessment/Plan: 1 Day Post-Op Procedure(s) (LRB): TOTAL HIP ARTHROPLASTY ANTERIOR APPROACH (Left) Up with therapy  Adrian Tucker Y 11/29/2011, 8:55 AM

## 2011-11-29 NOTE — Evaluation (Signed)
Physical Therapy Evaluation Patient Details Name: Adrian Tucker MRN: [id number] DOB: 1961-02-04 Today's Date: 11/29/2011  Problem List:  Patient Active Problem List  Diagnoses  . Degenerative arthritis of hip    Past Medical History:  Past Medical History  Diagnosis Date  . Recurrent upper respiratory infection (URI)     chest congestion 2-3 weeks ago- cough, no fever- states improved  . Arthritis   . Hypertension      stress test 2009- reports neg- unsure testing site/lost 60 lbs 2 yrs ago and is off meds now  . Seasonal allergies   . Sleep apnea     STOP BANG SCORE 4  . Diabetes mellitus    Past Surgical History:  Past Surgical History  Procedure Date  . Tonsillectomy   . Knee arthrotomy 2009    left    PT Assessment/Plan/Recommendation PT Assessment Clinical Impression Statement: Pt presents s/p L THR (anterior approach) POD 1 with decreased strength, ROM, and mobility.  Pt tolerated ambulation well with slight antalgic gait pattern.  Pt will benefit from skilled PT in acute venue to address deficits.  PT recommends HHPT for follow up therapy at D/C.  PT Recommendation/Assessment: Patient will need skilled PT in the acute care venue PT Problem List: Decreased strength;Decreased range of motion;Decreased activity tolerance;Decreased mobility;Decreased knowledge of use of DME;Decreased safety awareness Barriers to Discharge: None PT Therapy Diagnosis : Abnormality of gait;Generalized weakness PT Plan PT Frequency: 7X/week PT Treatment/Interventions: DME instruction;Gait training;Stair training;Functional mobility training;Therapeutic activities;Therapeutic exercise;Patient/family education PT Recommendation Follow Up Recommendations: Home health PT Equipment Recommended: None recommended by PT PT Goals  Acute Rehab PT Goals PT Goal Formulation: With patient Time For Goal Achievement: 3 days Pt will go Supine/Side to Sit: with modified independence PT Goal:  Supine/Side to Sit - Progress: Goal set today Pt will go Sit to Supine/Side: with modified independence PT Goal: Sit to Supine/Side - Progress: Goal set today Pt will go Sit to Stand: with modified independence PT Goal: Sit to Stand - Progress: Goal set today Pt will go Stand to Sit: with modified independence PT Goal: Stand to Sit - Progress: Goal set today Pt will Ambulate: >150 feet;with modified independence;with least restrictive assistive device PT Goal: Ambulate - Progress: Goal set today Pt will Go Up / Down Stairs: 3-5 stairs;with rail(s);with supervision;with least restrictive assistive device PT Goal: Up/Down Stairs - Progress: Goal set today Pt will Perform Home Exercise Program: Independently PT Goal: Perform Home Exercise Program - Progress: Goal set today  PT Evaluation Precautions/Restrictions  Precautions Required Braces or Orthoses: No Restrictions Weight Bearing Restrictions: No Prior Functioning  Home Living Lives With: Spouse Receives Help From: Family Type of Home: House Home Layout: One level Home Access: Stairs to enter Entrance Stairs-Rails: Can reach both Entrance Stairs-Number of Steps: 4 Home Adaptive Equipment: Walker - rolling;Straight cane;Crutches Prior Function Level of Independence: Independent with basic ADLs;Independent with gait;Independent with transfers Driving: Yes Vocation: Full time employment Cognition Cognition Arousal/Alertness: Awake/alert Overall Cognitive Status: Appears within functional limits for tasks assessed Orientation Level: Oriented X4 Sensation/Coordination Sensation Light Touch: Appears Intact Coordination Gross Motor Movements are Fluid and Coordinated: Yes Extremity Assessment RLE Assessment RLE Assessment: Within Functional Limits LLE Assessment LLE Assessment: Exceptions to [facility] LLE Strength LLE Overall Strength Comments: Ankle motions 5/5, knee flex 2-/5, hip abduction 2-/5.  Mobility (including  Balance) Bed Mobility Bed Mobility: Yes Supine to Sit: 4: Min assist Supine to Sit Details (indicate cue type and reason): Min assist for LLE  off of bed with cues for technique and UE placement.  Transfers Transfers: Yes Sit to Stand: 5: Supervision;From elevated surface;With upper extremity assist;From bed Sit to Stand Details (indicate cue type and reason): Supervision for safety with cues for safety and hand placement.  Pt demos slight impulsivity.  Stand to Sit: 5: Supervision;With upper extremity assist;With armrests;To chair/3-in-1 Stand to Sit Details: Supervision for safety with cues for hand placement.  Ambulation/Gait Ambulation/Gait: Yes Ambulation/Gait Assistance: 5: Supervision Ambulation/Gait Assistance Details (indicate cue type and reason): Supervision for safety with cues for proper step through technique due to pt tendency to demo antalgic gait pattern.  cues for upright posture.  Ambulation Distance (Feet): 400 Feet Assistive device: Rolling walker Gait Pattern: Step-through pattern;Antalgic;Trunk flexed Gait velocity: WFL Stairs: No Wheelchair Mobility Wheelchair Mobility: No  Posture/Postural Control Posture/Postural Control: No significant limitations Balance Balance Assessed: Yes Static Standing Balance Static Standing - Balance Support:  (Intermittent UE support while talking with PA. ) Static Standing - Level of Assistance: 5: Stand by assistance Static Standing - Comment/# of Minutes: Stand by assist x 3 mins while talking with PA with intermittent UE support on RW.  Exercise    End of Session PT - End of Session Activity Tolerance: Patient tolerated treatment well Patient left: in chair;with call bell in reach;with family/visitor present Nurse Communication: Mobility status for transfers;Mobility status for ambulation General Behavior During Session: [facility] for tasks performed Cognition: [facility] for tasks performed  Page, Meribeth Mattes 11/29/2011, 9:21  AM

## 2011-11-30 LAB — CBC
MCH: 31.6 pg (ref 26.0–34.0)
MCV: 92.2 fL (ref 78.0–100.0)
Platelets: 153 10*3/uL (ref 150–400)
RBC: 2.69 MIL/uL — ABNORMAL LOW (ref 4.22–5.81)
RDW: 12.6 % (ref 11.5–15.5)
WBC: 8.7 10*3/uL (ref 4.0–10.5)

## 2011-11-30 LAB — GLUCOSE, CAPILLARY: Glucose-Capillary: 163 mg/dL — ABNORMAL HIGH (ref 70–99)

## 2011-11-30 MED ORDER — MAGNESIUM CITRATE PO SOLN
1.0000 | Freq: Every day | ORAL | Status: DC | PRN
  Administered 2011-11-30: 1 via ORAL

## 2011-11-30 NOTE — Progress Notes (Signed)
Physical Therapy Treatment Patient Details Name: Adrian Tucker MRN: [id number] DOB: 02/18/1961 Today's Date: 11/30/2011  PT Assessment/Plan  PT - Assessment/Plan Comments on Treatment Session: Pt doing well.  C/o tightness in distal quads.  Stairs in PM. PT Plan: Discharge plan remains appropriate;Frequency remains appropriate PT Frequency: 7X/week Follow Up Recommendations: Home health PT Equipment Recommended: None recommended by PT PT Goals  Acute Rehab PT Goals Pt will go Supine/Side to Sit: with modified independence PT Goal: Supine/Side to Sit - Progress: Progressing toward goal Pt will go Sit to Supine/Side: with modified independence PT Goal: Sit to Supine/Side - Progress: Progressing toward goal Pt will go Sit to Stand: with modified independence PT Goal: Sit to Stand - Progress: Progressing toward goal Pt will go Stand to Sit: with modified independence PT Goal: Stand to Sit - Progress: Progressing toward goal Pt will Ambulate: >150 feet;with modified independence;with least restrictive assistive device PT Goal: Ambulate - Progress: Progressing toward goal Pt will Perform Home Exercise Program: Independently PT Goal: Perform Home Exercise Program - Progress: Progressing toward goal  PT Treatment Precautions/Restrictions  Precautions Required Braces or Orthoses: No Restrictions Weight Bearing Restrictions: No Mobility (including Balance) Bed Mobility Supine to Sit: 5: Supervision;HOB elevated (Comment degrees) Transfers Sit to Stand: 5: Supervision;From elevated surface;With upper extremity assist;From bed Stand to Sit: 5: Supervision;With upper extremity assist;To bed Stand to Sit Details: cues to keep RW with him before sitting Ambulation/Gait Ambulation/Gait Assistance: 5: Supervision Ambulation/Gait Assistance Details (indicate cue type and reason): Cues for proper technique and to increase heel strike. Ambulation Distance (Feet): 400 Feet Assistive device:  Rolling walker Gait Pattern: Step-through pattern;Antalgic Gait velocity: [facility]    Exercise  Total Joint Exercises Ankle Circles/Pumps: AROM;Strengthening;Both;10 reps;Supine Quad Sets: Left;10 reps;Strengthening;Supine Gluteal Sets: Strengthening;Both;10 reps;Supine Heel Slides: AAROM;Left;10 reps;Supine Hip ABduction/ADduction: AAROM;Left;10 reps;Supine End of Session PT - End of Session Equipment Utilized During Treatment: Gait belt Activity Tolerance: Patient tolerated treatment well Patient left: in chair;with family/visitor present General Behavior During Session: [facility] for tasks performed Cognition: [facility] for tasks performed  [facility] LUBECK 11/30/2011, 12:51 PM

## 2011-11-30 NOTE — Progress Notes (Signed)
Subjective: "my thigh spasms" - pt is able to walk in hall   Objective: Vital signs in last 24 hours: Temp:  [97.7 F (36.5 C)-98.8 F (37.1 C)] 98.7 F (37.1 C) (03/24 0617) Pulse Rate:  [64-88] 76  (03/24 0617) Resp:  [18] 18  (03/24 0617) BP: (112-123)/(69) 113/69 mmHg (03/24 0617) SpO2:  [96 %-98 %] 96 % (03/24 0617)  Intake/Output from previous day: 03/23 0701 - 03/24 0700 In: 1205.8 [P.O.:480; I.V.:725.8] Out: 6 [Urine:6] Intake/Output this shift:    Exam:  Neurologically intact Dorsiflexion/Plantar flexion intact  Labs:  Basename 11/30/11 0521 11/29/11 0515  HGB 8.5* 9.9*    Basename 11/30/11 0521 11/29/11 0515  WBC 8.7 9.6  RBC 2.69* 3.13*  HCT 24.8* 28.1*  PLT 153 162    Basename 11/29/11 0515 11/28/11 1810  NA 133* --  K 4.4 --  CL 100 --  CO2 28 --  BUN 18 --  CREATININE 0.74 --  GLUCOSE 246* 400*  CALCIUM 8.8 --   No results found for this basename: LABPT:2,INR:2 in the last 72 hours  Assessment/Plan: Pt doing well - hl iv - mobilize with PT - possible dc mon/tues   DEAN,GREGORY SCOTT 11/30/2011, 9:53 AM

## 2011-11-30 NOTE — Progress Notes (Signed)
Cm spoke with pt concerning d/c planning. Per pt previously set-up with [facility] prior to surgery for [facility] services. Per pt [facility] is choice for HHPT upon discharge. Pt has access to DME. Spouse present at bedside during interview to assist in home care.   Leonie Green [phone]

## 2011-12-01 LAB — CBC
HCT: 25.7 % — ABNORMAL LOW (ref 39.0–52.0)
Hemoglobin: 8.8 g/dL — ABNORMAL LOW (ref 13.0–17.0)
RBC: 2.8 MIL/uL — ABNORMAL LOW (ref 4.22–5.81)

## 2011-12-01 MED ORDER — METHOCARBAMOL 500 MG PO TABS: 500.0000 mg | ORAL_TABLET | Freq: Four times a day (QID) | ORAL | Status: AC | PRN

## 2011-12-01 MED ORDER — RIVAROXABAN 10 MG PO TABS: 10.0000 mg | ORAL_TABLET | Freq: Every day | ORAL | Status: DC

## 2011-12-01 MED ORDER — OXYCODONE-ACETAMINOPHEN 5-325 MG PO TABS: 1.0000 | ORAL_TABLET | ORAL | Status: AC | PRN

## 2011-12-01 NOTE — Discharge Summary (Signed)
Patient ID: Adrian Tucker MRN: [id number] DOB/AGE: June 25, 1961 51 y.o.  Admit date: 11/28/2011 Discharge date: 12/01/2011  Admission Diagnoses:  Principal Problem:  *Degenerative arthritis of hip   Discharge Diagnoses:  Same  Past Medical History  Diagnosis Date  . Recurrent upper respiratory infection (URI)     chest congestion 2-3 weeks ago- cough, no fever- states improved  . Arthritis   . Hypertension      stress test 2009- reports neg- unsure testing site/lost 60 lbs 2 yrs ago and is off meds now  . Seasonal allergies   . Sleep apnea     STOP BANG SCORE 4  . Diabetes mellitus     Surgeries: Procedure(s): TOTAL HIP ARTHROPLASTY ANTERIOR APPROACH on 11/28/2011   Consultants:    Discharged Condition: Improved  Hospital Course: AFSHIN CHRYSTAL is an 51 y.o. male who was admitted 11/28/2011 for operative treatment ofDegenerative arthritis of hip. Patient has severe unremitting pain that affects sleep, daily activities, and work/hobbies. After pre-op clearance the patient was taken to the operating room on 11/28/2011 and underwent  Procedure(s): TOTAL HIP ARTHROPLASTY ANTERIOR APPROACH.    Patient was given perioperative antibiotics: Anti-infectives     Start     Dose/Rate Route Frequency Ordered Stop   11/28/11 1400   ceFAZolin (ANCEF) IVPB 1 g/50 mL premix        1 g 100 mL/hr over 30 Minutes Intravenous Every 6 hours 11/28/11 1143 11/29/11 0303   11/28/11 0524   ceFAZolin (ANCEF) IVPB 2 g/50 mL premix  Status:  Discontinued        2 g 100 mL/hr over 30 Minutes Intravenous 60 min pre-op 11/28/11 0524 11/28/11 1118           Patient was given sequential compression devices, early ambulation, and chemoprophylaxis to prevent DVT.  Patient benefited maximally from hospital stay and there were no complications.    Recent vital signs: Patient Vitals for the past 24 hrs:  BP Temp Temp src Pulse Resp SpO2  12/01/11 0550 119/75 mmHg 98.4 F (36.9 C) Oral 62  16  94 %   2011/12/18 2145 117/73 mmHg 99.2 F (37.3 C) Oral 85  20  94 %  12/18/2011 1457 130/77 mmHg 99.7 F (37.6 C) Oral 74  18  96 %     Recent laboratory studies:  Basename 12/01/11 0437 12/18/11 0521 11/29/11 0515 11/28/11 1810  WBC 9.8 8.7 -- --  HGB 8.8* 8.5* -- --  HCT 25.7* 24.8* -- --  PLT 172 153 -- --  NA -- -- 133* --  K -- -- 4.4 --  CL -- -- 100 --  CO2 -- -- 28 --  BUN -- -- 18 --  CREATININE -- -- 0.74 --  GLUCOSE -- -- 246* 400*  INR -- -- -- --  CALCIUM -- -- 8.8 --     Discharge Medications:   Medication List  As of 12/01/2011  6:43 AM   STOP taking these medications         meloxicam 15 MG tablet      naproxen sodium 220 MG tablet         TAKE these medications         methocarbamol 500 MG tablet   Commonly known as: ROBAXIN   Take 1 tablet (500 mg total) by mouth every 6 (six) hours as needed.      oxyCODONE-acetaminophen 5-325 MG per tablet   Commonly known as: PERCOCET   Take 1-2 tablets  by mouth every 4 (four) hours as needed for pain.      rivaroxaban 10 MG Tabs tablet   Commonly known as: XARELTO   Take 1 tablet (10 mg total) by mouth daily with breakfast.            Diagnostic Studies: Dg Chest 2 View  11/20/2011  *RADIOLOGY REPORT*  Clinical Data: Preop evaluation for hip replacement, hypertension, diabetes  CHEST - 2 VIEW  Comparison: None.  Findings: Normal heart size and vascularity.  Negative for CHF or pneumonia.  No focal airspace disease, collapse, consolidation, effusion or pneumothorax.  Trachea midline.  Degenerative changes of the spine.  IMPRESSION: No acute chest process  Original Report Authenticated By: Judie Petit. Ruel Favors, M.D.   Dg Hip Complete Left  11/28/2011  *RADIOLOGY REPORT*  Clinical Data: Left total hip replacement  LEFT HIP - COMPLETE 2+ VIEW  Comparison: None.  Findings: Two C-arm spot films show a left total hip replacement to be present.  The acetabular and femoral components appear to be in good position with no  complicating features.  IMPRESSION: Left total hip replacement.  Original Report Authenticated By: Juline Patch, M.D.   Dg Pelvis Portable  11/28/2011  *RADIOLOGY REPORT*  Clinical Data: Postop  PORTABLE PELVIS  Comparison: None.  Findings: Single frontal view of the pelvis submitted.  There is a left hip prosthesis anatomic alignment.  Postsurgical changes are noted with lateral skin staples and small amount of left hip periarticular soft tissue air.  IMPRESSION: Left hip prosthesis in anatomic alignment.  Postsurgical changes are noted.  Original Report Authenticated By: Natasha Mead, M.D.   Dg Hip Portable 1 View Left  11/28/2011  *RADIOLOGY REPORT*  Clinical Data: Status post hip replacement.  PORTABLE LEFT HIP - 1 VIEW  Comparison: Intraoperative view of the left hip earlier this same date.  Findings: The patient has a left total hip replacement.  Device is located.  No fracture.  Surgical staples noted.  IMPRESSION: Left total hip without evidence of complication.  Original Report Authenticated By: Bernadene Bell. D'ALESSIO, M.D.   Dg C-arm 61-120 Min-no Report  11/28/2011  CLINICAL DATA: anterior hip left   C-ARM 61-120 MINUTES  Fluoroscopy was utilized by the requesting physician.  No radiographic  interpretation.      Disposition: to home  Discharge Orders    Future Orders Please Complete By Expires   Diet - low sodium heart healthy      Call MD / Call 911      Comments:   If you experience chest pain or shortness of breath, CALL 911 and be transported to the hospital emergency room.  If you develope a fever above 101 F, pus (white drainage) or increased drainage or redness at the wound, or calf pain, call your surgeon's office.   Constipation Prevention      Comments:   Drink plenty of fluids.  Prune juice may be helpful.  You may use a stool softener, such as Colace (over the counter) 100 mg twice a day.  Use MiraLax (over the counter) for constipation as needed.   Increase activity slowly  as tolerated      Weight Bearing as taught in Physical Therapy      Comments:   Use a walker or crutches as instructed.   Discharge instructions      Comments:   You can get your current dressing wet in the shower. You can get your actual incision wet starting 3/27. You may  increase your activities as comfort allows.   Discontinue IV      Discharge patient      Comments:   Discharge to home today.         SignedKathryne Hitch 12/01/2011, 6:43 AM

## 2011-12-01 NOTE — Progress Notes (Signed)
D/c instructions given and explained to pt.  Prescriptions for xarelto, robaxin, and percocet given to pt, placed in pt's hands.  Iv site d/c, pressure dressing applied to site.  Pt given dressing supplies also.  Pt asked for xrays on cd, will call and ask for pt.  Pt d/c complete.  Pt stable and ready for d/c.  Will d/c when gets xray cd.

## 2011-12-01 NOTE — Progress Notes (Signed)
Physical Therapy Treatment Patient Details Name: JACIEL DIEM MRN: [id number] DOB: 09-Jan-1961 Today's Date: 12/01/[phone]  PT Assessment/Plan  PT - Assessment/Plan Comments on Treatment Session: pt is ready to [facility].  PT Goals  Acute Rehab PT Goals Pt will go Supine/Side to Sit: with modified independence PT Goal: Supine/Side to Sit - Progress: Progressing toward goal Pt will go Sit to Supine/Side: with modified independence PT Goal: Sit to Supine/Side - Progress: Progressing toward goal Pt will go Sit to Stand: with modified independence PT Goal: Sit to Stand - Progress: Met Pt will go Stand to Sit: with modified independence PT Goal: Stand to Sit - Progress: Met Pt will Ambulate: >150 feet;with modified independence;with least restrictive assistive device PT Goal: Ambulate - Progress: Met Pt will Go Up / Down Stairs: 3-5 stairs;with rail(s);with supervision;with least restrictive assistive device PT Goal: Up/Down Stairs - Progress: Met Pt will Perform Home Exercise Program: Independently PT Goal: Perform Home Exercise Program - Progress: Met  PT Treatment Precautions/Restrictions  Precautions Required Braces or Orthoses: No Restrictions Weight Bearing Restrictions: No Mobility (including Balance) Bed Mobility Supine to Sit: 5: Supervision Supine to Sit Details (indicate cue type and reason): demonstrated leg lifter to pt for bed mobility. stated he may sleep in recliner Transfers Sit to Stand: 5: Supervision;From elevated surface;With upper extremity assist;From bed Stand to Sit: 5: Supervision;With upper extremity assist;To bed Ambulation/Gait Ambulation/Gait Assistance: 5: Supervision Ambulation Distance (Feet): 400 Feet Assistive device: Rolling walker Gait Pattern: Step-through pattern;Antalgic Gait velocity: WFL Stairs: Yes Stairs Assistance: 5: Supervision Stairs Assistance Details (indicate cue type and reason): 2 Stair Management Technique: Two  rails;Forwards Number of Stairs: 2  Height of Stairs: 6     Exercise  Total Joint Exercises Ankle Circles/Pumps: AROM;Strengthening;Both;10 reps;Supine Quad Sets: Left;10 reps;Strengthening;Supine Gluteal Sets: Strengthening;Both;10 reps;Supine Short Arc Quad: Strengthening;Left;10 reps;Supine Heel Slides: AAROM;Left;10 reps;Supine Hip ABduction/ADduction: AAROM;Left;10 reps;Supine End of Session PT - End of Session Activity Tolerance: Patient tolerated treatment well Patient left: in chair General Behavior During Session: [facility] for tasks performed  Rada Hay 12/01/2011, 5:09 PM

## 2011-12-01 NOTE — Progress Notes (Signed)
CARE MANAGEMENT NOTE 12/01/2011  Patient:  Adrian Tucker, Adrian Tucker   Account Number:  [id number]  Date Initiated:  11/30/2011  Documentation initiated by:  DAVIS,TYMEEKA  Subjective/Objective Assessment:   51 yo male admitted s/p left total hip replacement. PTA pt lived with spouse.     Action/Plan:   Home when stable   Anticipated DC Date:  12/01/2011   Anticipated DC Plan:  HOME W HOME HEALTH SERVICES  In-house referral  NA      DC Planning Services  CM consult      [facility] Choice  HOME HEALTH   Choice offered to / List presented to:  NA   DME arranged  NA      DME agency  NA     HH arranged  HH-2 PT      [facility] agency  [facility]   Status of service:  Completed, signed off Medicare Important Message given?  NO (If response is "NO", the following Medicare IM given date fields will be blank) Date Medicare IM given:   Date Additional Medicare IM given:    Discharge Disposition:  HOME W HOME HEALTH SERVICES   Comments:  12/01/2011 Raynelle Bring BSN CCM [phone] PT FOR DISCHARGE TODAY WITH hhpt SERVICES [facility] with start date of 12/02/2011. List of HH agencies placed in shadow chart.

## 2011-12-03 MED FILL — Insulin Aspart Inj 100 Unit/ML: SUBCUTANEOUS | Qty: 0.04 | Status: AC

## 2011-12-05 ENCOUNTER — Encounter (HOSPITAL_COMMUNITY): Payer: Self-pay | Admitting: Orthopaedic Surgery

## 2013-03-21 IMAGING — CR DG CHEST 2V
2 series · 2 of 2 positions shown · non-contrast
Comparison: None.

CLINICAL DATA: Preop evaluation for hip replacement, hypertension,
diabetes

CHEST - 2 VIEW

[w chest pa]
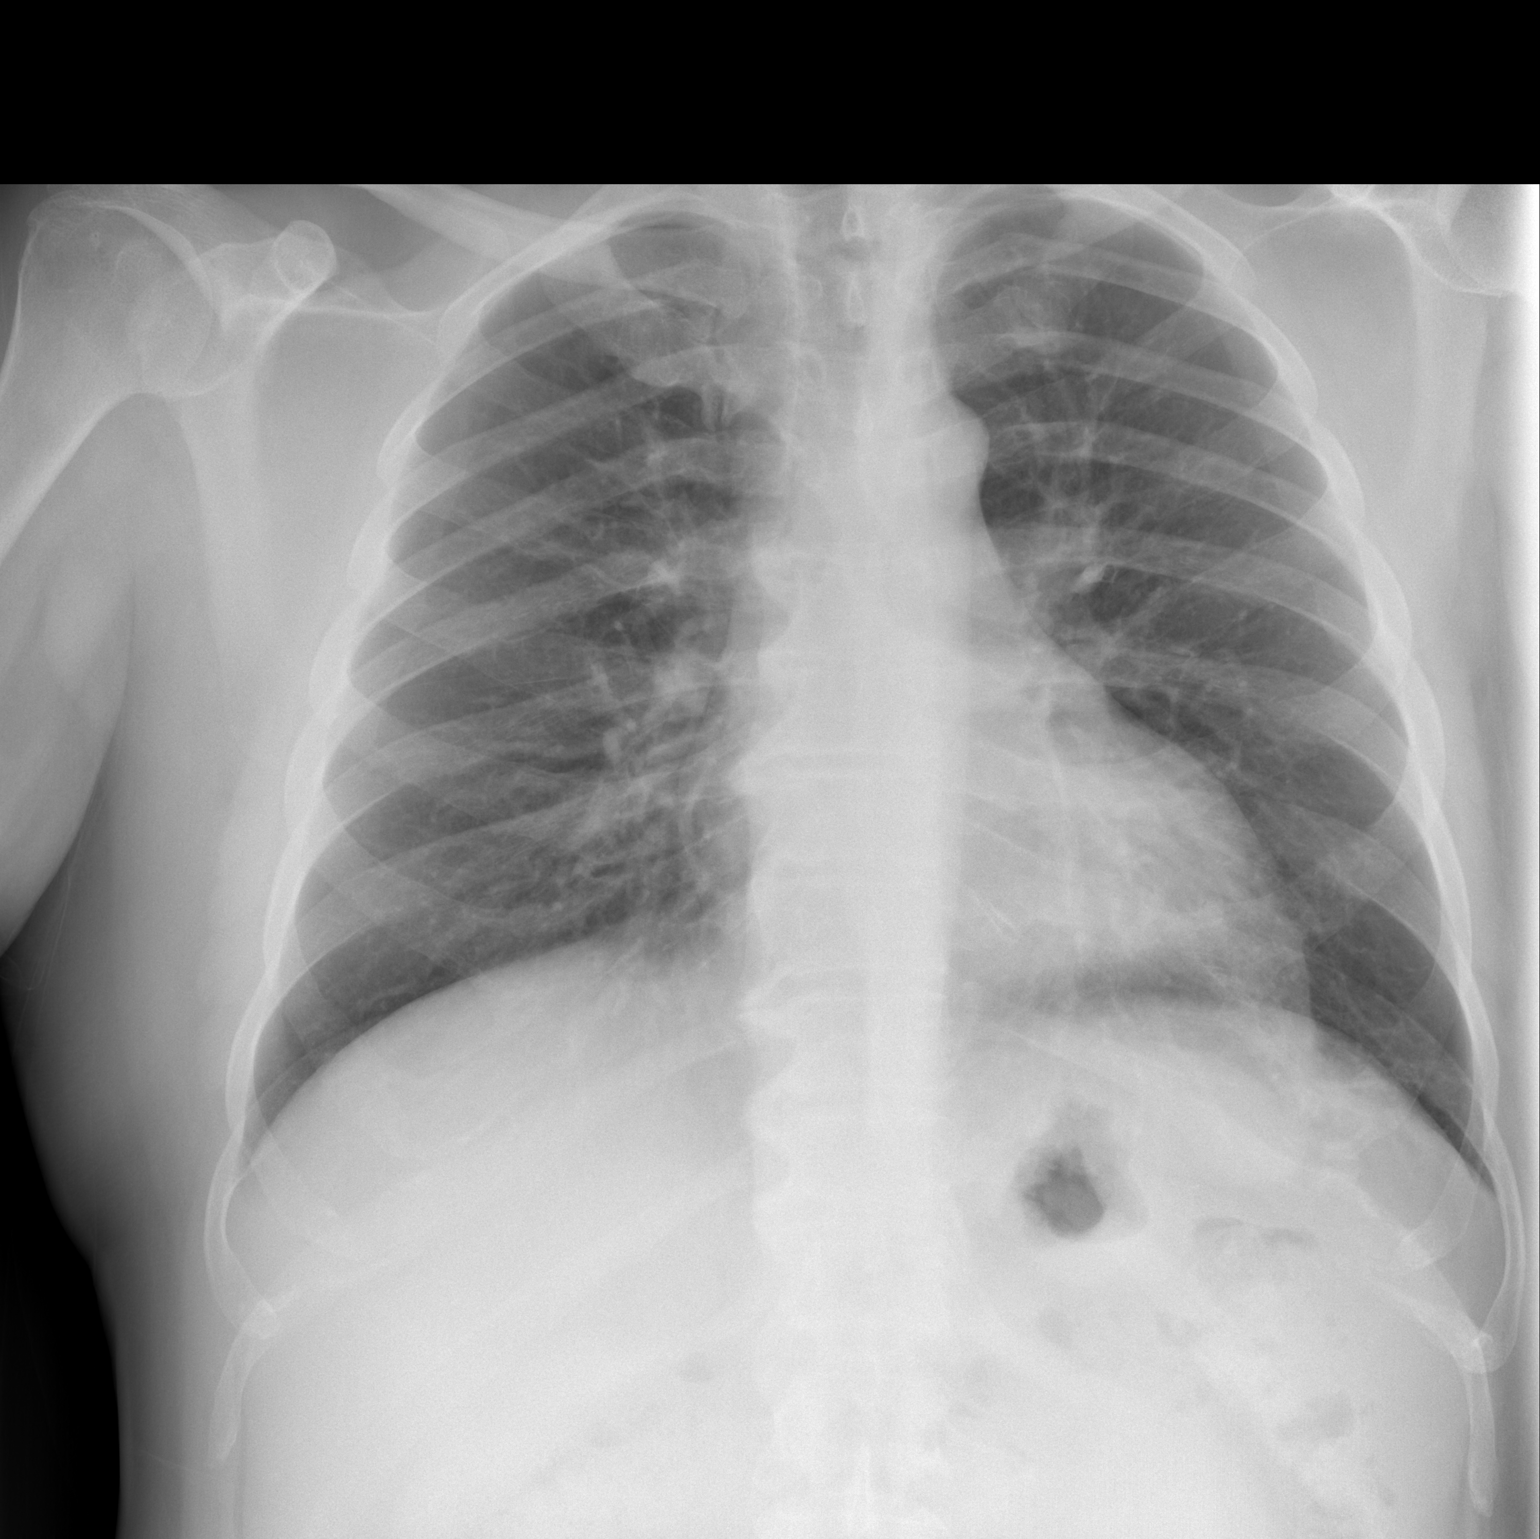

[w chest lat]
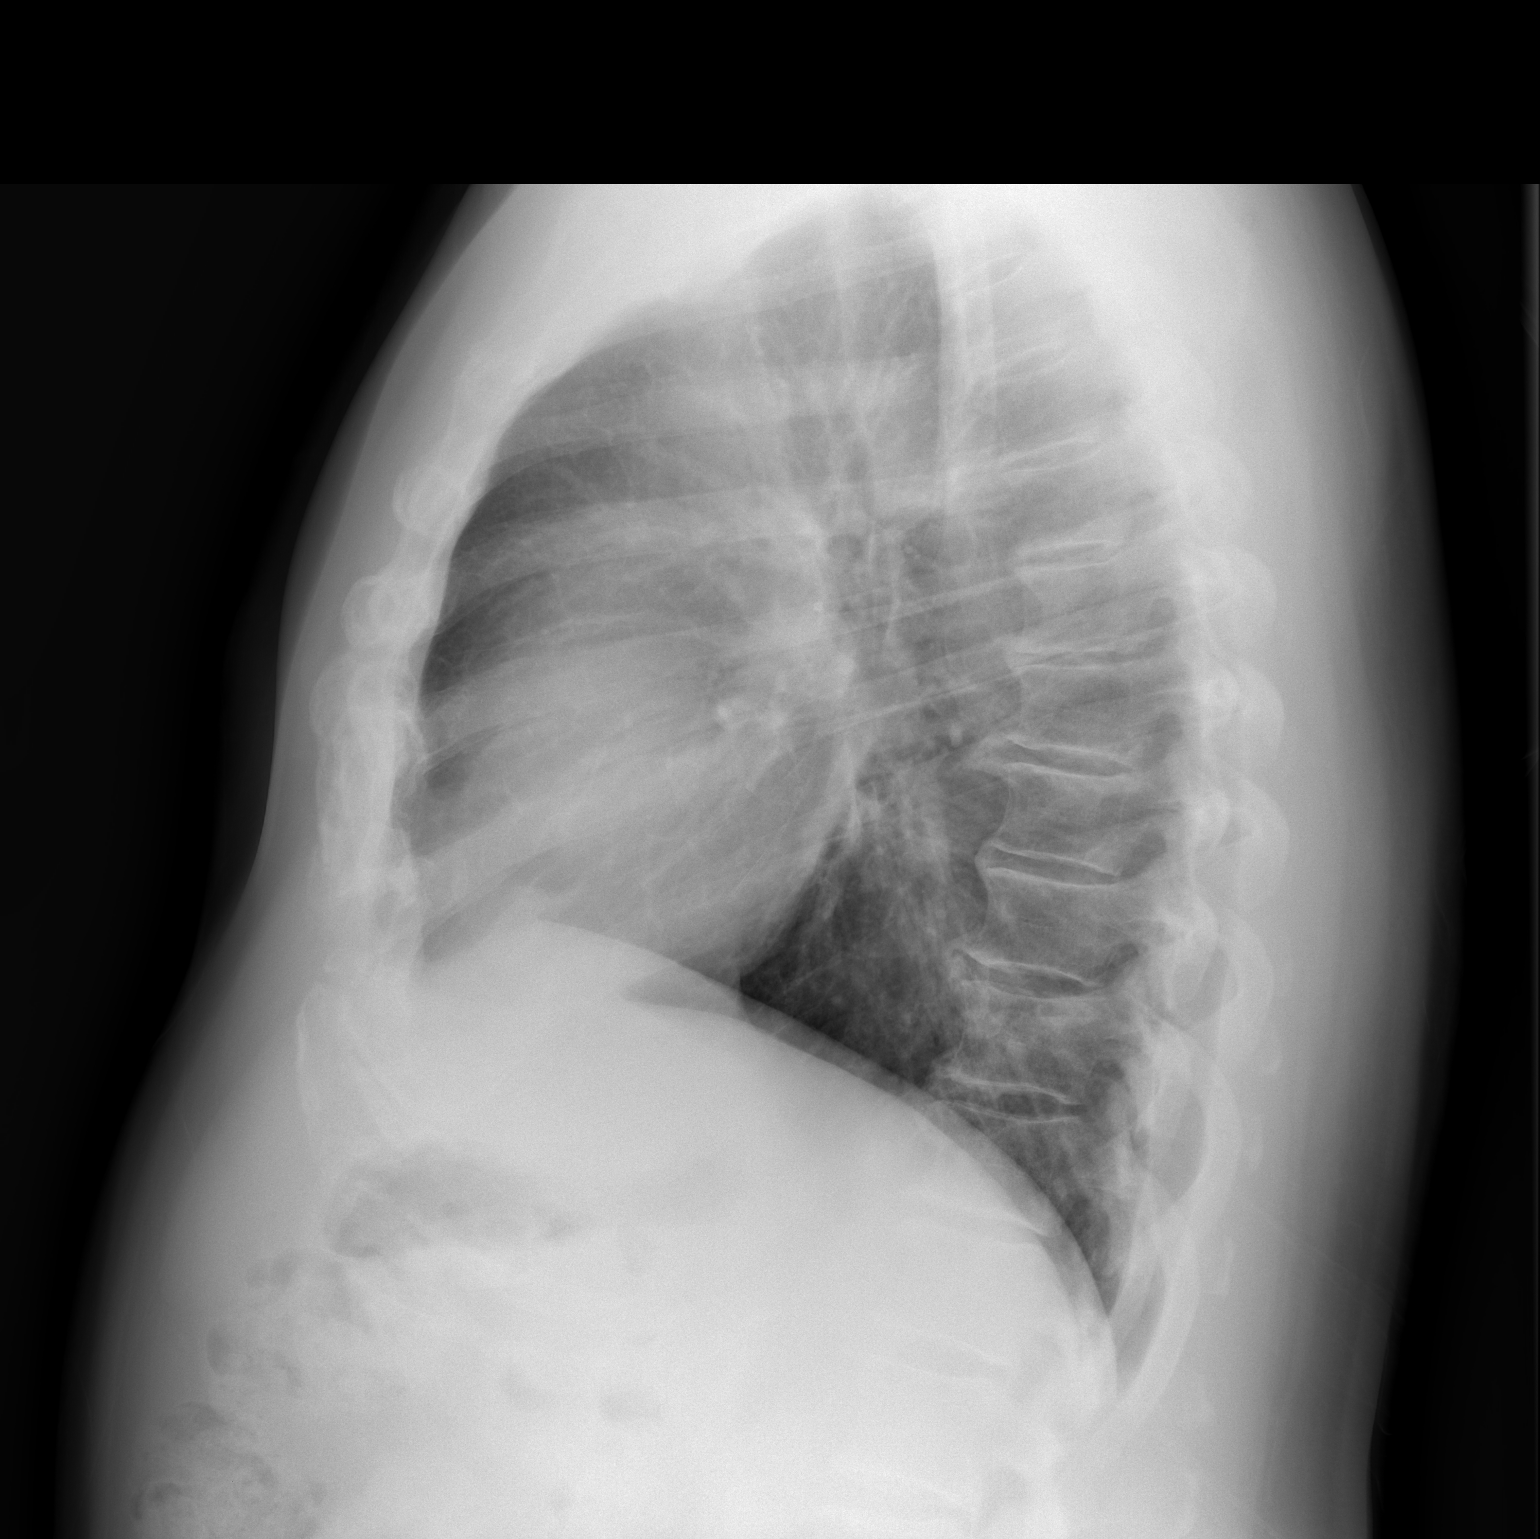

[2 of 2 positions shown; findings below may reference images not displayed]

FINDINGS: Normal heart size and vascularity.  Negative for CHF or
pneumonia.  No focal airspace disease, collapse, consolidation,
effusion or pneumothorax.  Trachea midline.  Degenerative changes
of the spine.
IMPRESSION: No acute chest process

## 2013-03-29 IMAGING — RF DG HIP (WITH OR WITHOUT PELVIS) 2-3V*L*
1 series · 2 of 2 positions shown · non-contrast
Comparison: None.

CLINICAL DATA: Left total hip replacement

LEFT HIP - COMPLETE 2+ VIEW

[Series 1: run · 2 of 2 slices shown]
[im 1/2]
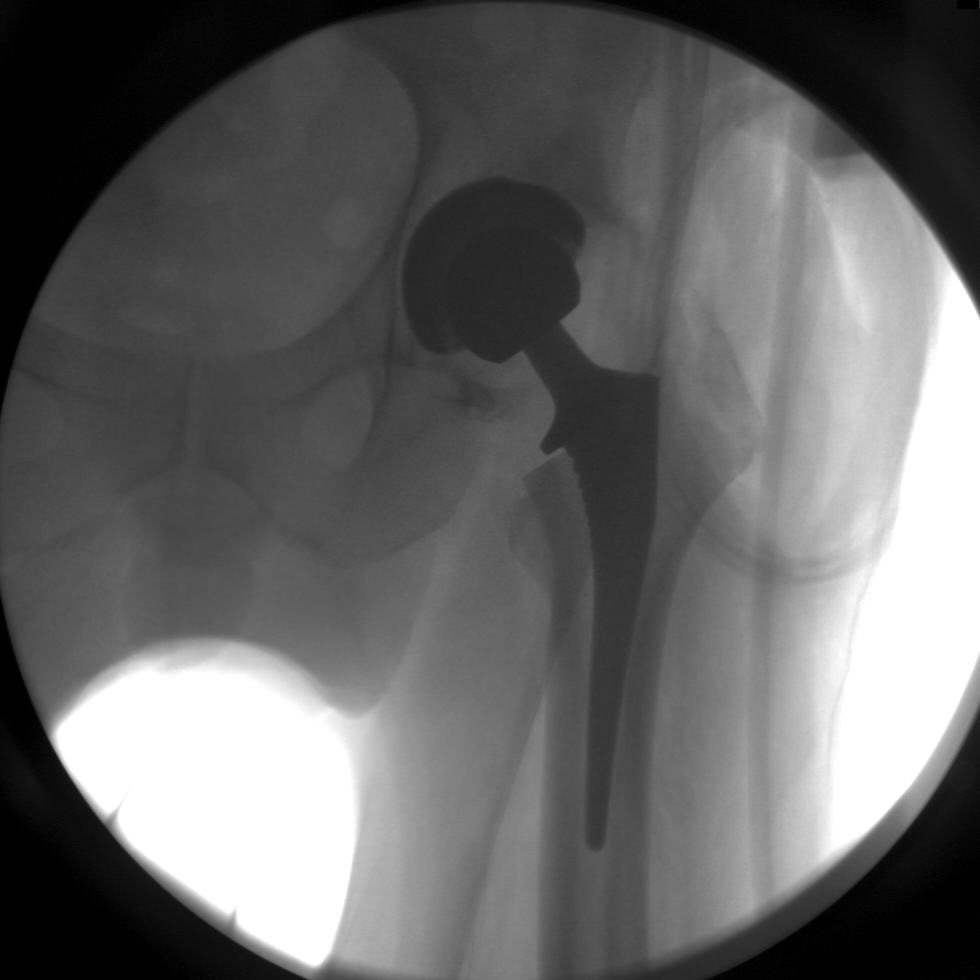
[im 2/2]
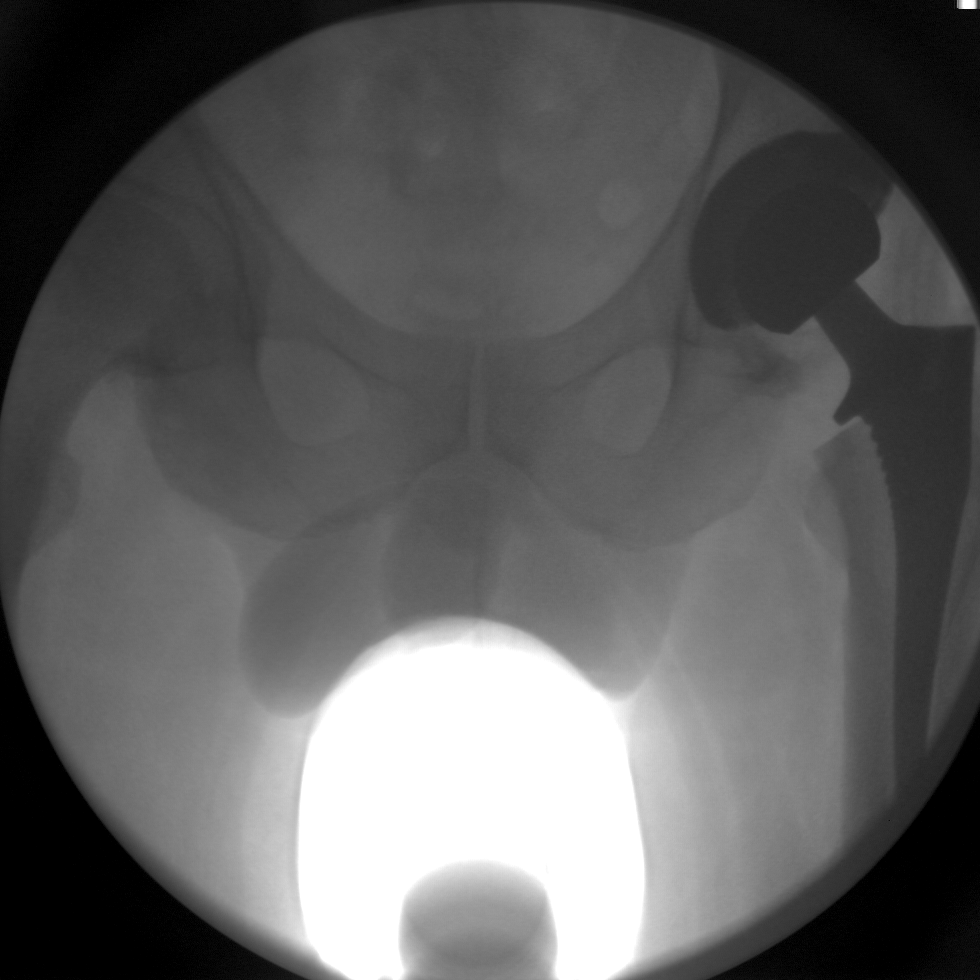

[2 of 2 positions shown; findings below may reference images not displayed]

FINDINGS: Two C-arm spot films show a left total hip replacement to
be present.  The acetabular and femoral components appear to be in
good position with no complicating features.
IMPRESSION: Left total hip replacement.

## 2013-03-29 IMAGING — CR DG PORTABLE PELVIS
1 series · 1 of 1 positions shown · non-contrast
Comparison: None.

CLINICAL DATA: Postop

PORTABLE PELVIS

[AP]
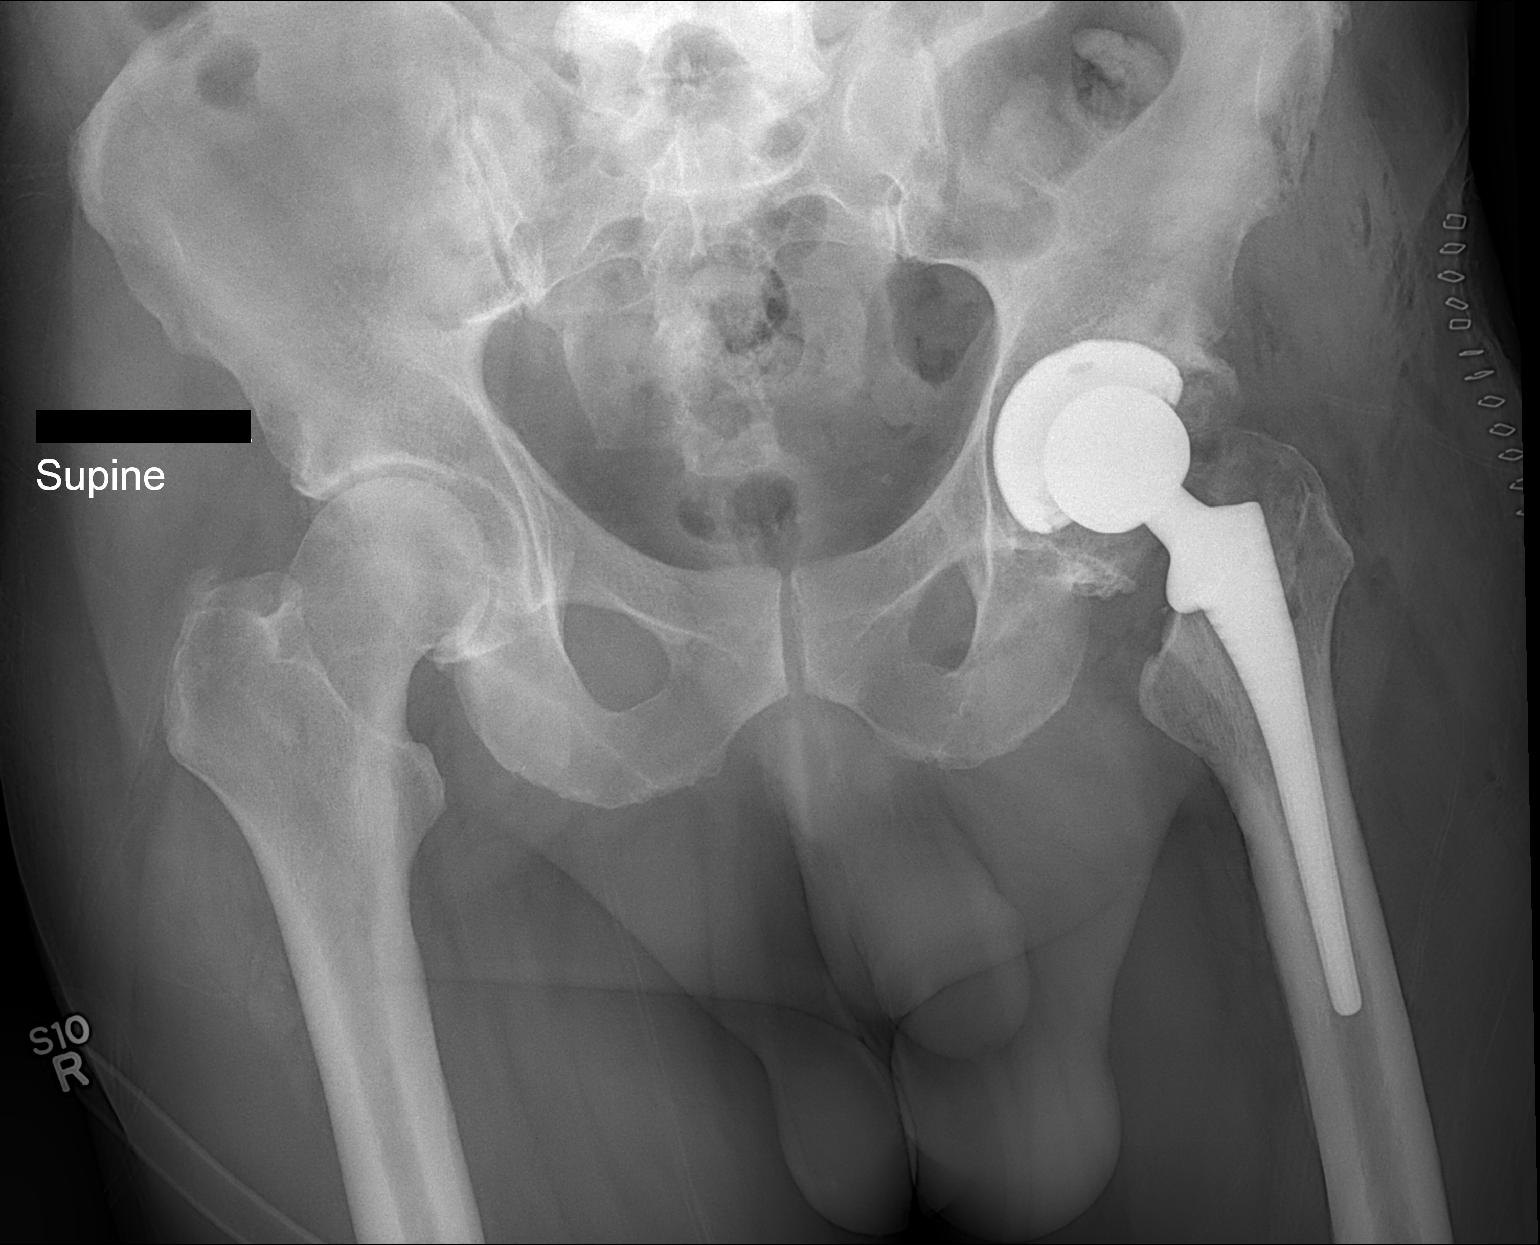

[1 of 1 positions shown; findings below may reference images not displayed]

FINDINGS: Single frontal view of the pelvis submitted.  There is a
left hip prosthesis anatomic alignment.  Postsurgical changes are
noted with lateral skin staples and small amount of left hip
periarticular soft tissue air.
IMPRESSION: Left hip prosthesis in anatomic alignment.  Postsurgical changes
are noted.

## 2013-03-29 IMAGING — CR DG HIP 1V PORT*L*
1 series · 1 of 1 positions shown · non-contrast
Comparison: Intraoperative view of the left hip earlier this same
date.

CLINICAL DATA: Status post hip replacement.

PORTABLE LEFT HIP - 1 VIEW

[AP]
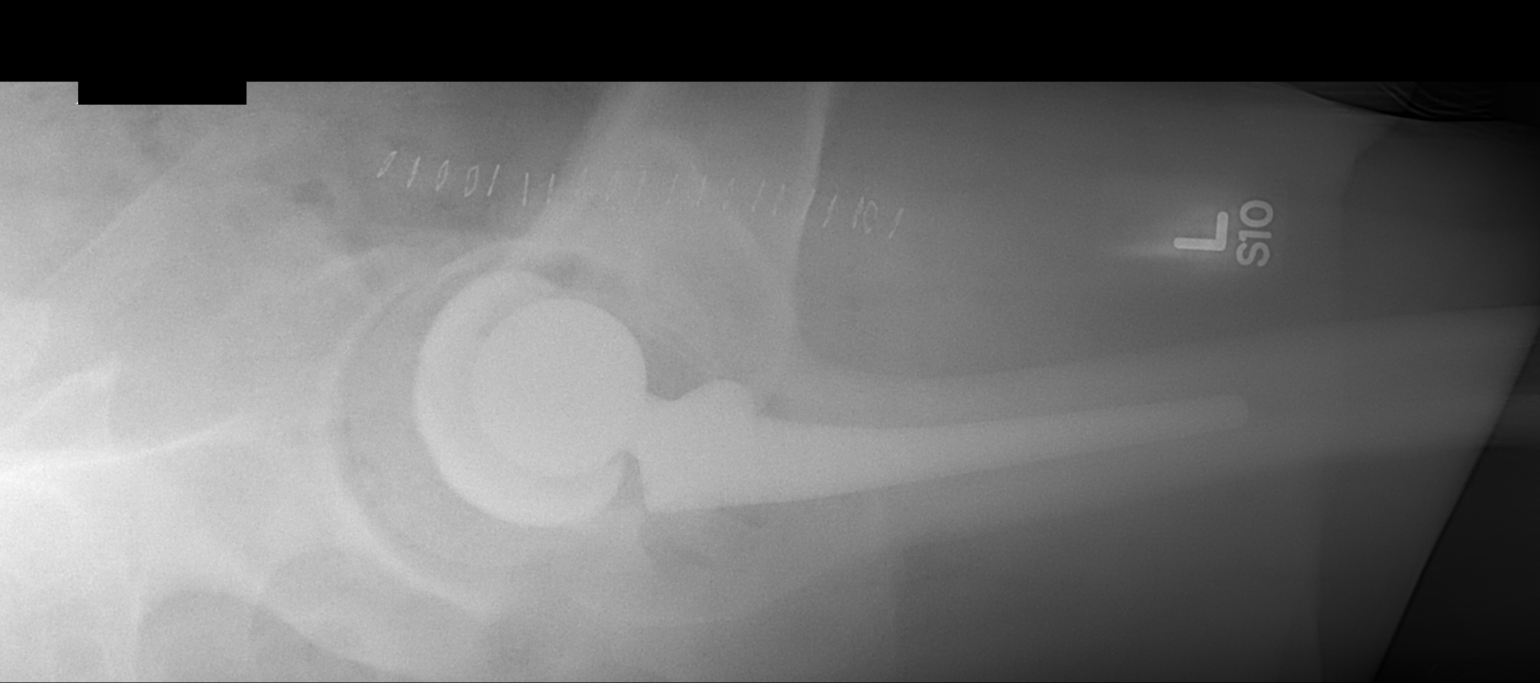

[1 of 1 positions shown; findings below may reference images not displayed]

FINDINGS: The patient has a left total hip replacement.  Device is
located.  No fracture.  Surgical staples noted.
IMPRESSION: Left total hip without evidence of complication.

## 2014-10-20 LAB — HM DIABETES EYE EXAM

## 2014-12-19 LAB — LIPID PANEL
CHOLESTEROL: 182 mg/dL (ref 0–200)
HDL: 36 mg/dL (ref 35–70)
LDL Cholesterol: 88 mg/dL
TRIGLYCERIDES: 288 mg/dL — AB (ref 40–160)

## 2014-12-28 ENCOUNTER — Ambulatory Visit (INDEPENDENT_AMBULATORY_CARE_PROVIDER_SITE_OTHER): Payer: BC Managed Care – PPO | Admitting: Physician Assistant

## 2014-12-28 ENCOUNTER — Encounter: Payer: Self-pay | Admitting: Physician Assistant

## 2014-12-28 ENCOUNTER — Encounter (INDEPENDENT_AMBULATORY_CARE_PROVIDER_SITE_OTHER): Payer: Self-pay

## 2014-12-28 VITALS — BP 164/104 | HR 82 | Temp 97.7°F | Ht 69.0 in | Wt 202.6 lb

## 2014-12-28 DIAGNOSIS — R030 Elevated blood-pressure reading, without diagnosis of hypertension: Secondary | ICD-10-CM

## 2014-12-28 DIAGNOSIS — R739 Hyperglycemia, unspecified: Secondary | ICD-10-CM

## 2014-12-28 DIAGNOSIS — R7309 Other abnormal glucose: Secondary | ICD-10-CM | POA: Diagnosis not present

## 2014-12-28 DIAGNOSIS — IMO0001 Reserved for inherently not codable concepts without codable children: Secondary | ICD-10-CM

## 2014-12-28 DIAGNOSIS — E119 Type 2 diabetes mellitus without complications: Secondary | ICD-10-CM | POA: Diagnosis not present

## 2014-12-28 LAB — POCT GLYCOSYLATED HEMOGLOBIN (HGB A1C): HEMOGLOBIN A1C: 12.5

## 2014-12-28 LAB — GLUCOSE, POCT (MANUAL RESULT ENTRY): POC Glucose: 271 mg/dl — AB (ref 70–99)

## 2014-12-28 MED ORDER — GLUCOSE BLOOD VI STRP: ORAL_STRIP | Status: DC

## 2014-12-28 MED ORDER — METFORMIN HCL 1000 MG PO TABS: 1000.0000 mg | ORAL_TABLET | Freq: Two times a day (BID) | ORAL | Status: DC

## 2014-12-28 NOTE — Patient Instructions (Addendum)
Diabetes and Exercise Exercising regularly is important. It is not just about losing weight. It has many health benefits, such as:  Improving your overall fitness, flexibility, and endurance.  Increasing your bone density.  Helping with weight control.  Decreasing your body fat.  Increasing your muscle strength.  Reducing stress and tension.  Improving your overall health. People with diabetes who exercise gain additional benefits because exercise:  Reduces appetite.  Improves the body's use of blood sugar (glucose).  Helps lower or control blood glucose.  Decreases blood pressure.  Helps control blood lipids (such as cholesterol and triglycerides).  Improves the body's use of the hormone insulin by:  Increasing the body's insulin sensitivity.  Reducing the body's insulin needs.  Decreases the risk for heart disease because exercising:  Lowers cholesterol and triglycerides levels.  Increases the levels of good cholesterol (such as high-density lipoproteins [HDL]) in the body.  Lowers blood glucose levels. YOUR ACTIVITY PLAN  Choose an activity that you enjoy and set realistic goals. Your health care provider or diabetes educator can help you make an activity plan that works for you. Exercise regularly as directed by your health care provider. This includes:  Performing resistance training twice a week such as push-ups, sit-ups, lifting weights, or using resistance bands.  Performing 150 minutes of cardio exercises each week such as walking, running, or playing sports.  Staying active and spending no more than 90 minutes at one time being inactive. Even short bursts of exercise are good for you. Three 10-minute sessions spread throughout the day are just as beneficial as a single 30-minute session. Some exercise ideas include:  Taking the dog for a walk.  Taking the stairs instead of the elevator.  Dancing to your favorite song.  Doing an exercise  video.  Doing your favorite exercise with a friend. RECOMMENDATIONS FOR EXERCISING WITH TYPE 1 OR TYPE 2 DIABETES   Check your blood glucose before exercising. If blood glucose levels are greater than 240 mg/dL, check for urine ketones. Do not exercise if ketones are present.  Avoid injecting insulin into areas of the body that are going to be exercised. For example, avoid injecting insulin into:  The arms when playing tennis.  The legs when jogging.  Keep a record of:  Food intake before and after you exercise.  Expected peak times of insulin action.  Blood glucose levels before and after you exercise.  The type and amount of exercise you have done.  Review your records with your health care provider. Your health care provider will help you to develop guidelines for adjusting food intake and insulin amounts before and after exercising.  If you take insulin or oral hypoglycemic agents, watch for signs and symptoms of hypoglycemia. They include:  Dizziness.  Shaking.  Sweating.  Chills.  Confusion.  Drink plenty of water while you exercise to prevent dehydration or heat stroke. Body water is lost during exercise and must be replaced.  Talk to your health care provider before starting an exercise program to make sure it is safe for you. Remember, almost any type of activity is better than none. Document Released: 11/15/2003 Document Revised: 01/09/2014 Document Reviewed: 02/01/2013 [employer] Patient Information 2015 [place], [place]. This information is not intended to replace advice given to you by your health care provider. Make sure you discuss any questions you have with your health care provider.    Monitor FBS in am  Check BP at home in am and after 6pm Log all readings.

## 2014-12-28 NOTE — Progress Notes (Signed)
   Subjective:    Patient ID: Adrian Tucker, male    DOB: [birth year], 54 y.o.   MRN: [id number]  HPI 54 y/o male presents for establishment of care. 10 years ago he was diagnosed with HTN and DM. However, he lost w90 lbs and was able to stop taking the medications. He was seen at a health fair yesterday and fbs was 350 and bp was 129/87. BP today initially was elevated. Patient feels that it is due to being at the dr.     Review of Systems  Constitutional: Negative.   HENT: Negative.   Eyes: Negative.        Last eye exam March 2016  Endocrine: Positive for polyuria.  Genitourinary: Positive for frequency.  Neurological: Negative.        Objective:   Physical Exam  Constitutional: He appears well-developed and well-nourished. No distress.  HENT:  Head: Normocephalic.  Cardiovascular: Normal rate, regular rhythm and normal heart sounds.   Pulmonary/Chest: Effort normal.  Pulmonary tightness in posterior LUL on inspiration and expiration   Skin: He is not diaphoretic.  Nursing note and vitals reviewed.         Assessment & Plan:  1. Elevated blood sugar  - POCT glucose (manual entry) - POCT glycosylated hemoglobin (Hb A1C) - metFORMIN (GLUCOPHAGE) 1000 MG tablet; Week 1 Take 1/2 tablet BID, Week 2 Take 1 tablet (1,000 mg total) by mouth 2 (two) times daily with a meal.  Dispense: 180 tablet; Refill: 3 Monitor fbs daily   2. Type 2 diabetes mellitus without complication  - metFORMIN (GLUCOPHAGE) 1000 MG tablet; Take 1 tablet (1,000 mg total) by mouth 2 (two) times daily with a meal.  Dispense: 180 tablet; Refill: 3 - glucose blood (ONETOUCH VERIO) test strip; Use as instructed  Dispense: 100 each; Refill: 12  3. Elevated BP CHeck BP at home am and after 6pm. Record and f/u in 4 weeks for recheck on BP and creatinine for metformin    Diet and exercise encouraged RTO 4weeks  Labs will be scanned in system from [place]. Benjamin Stain PA-C

## 2015-01-25 ENCOUNTER — Encounter: Payer: Self-pay | Admitting: Physician Assistant

## 2015-01-25 ENCOUNTER — Telehealth: Payer: Self-pay

## 2015-01-25 ENCOUNTER — Ambulatory Visit (INDEPENDENT_AMBULATORY_CARE_PROVIDER_SITE_OTHER): Payer: BC Managed Care – PPO | Admitting: Physician Assistant

## 2015-01-25 VITALS — BP 173/105 | HR 72 | Temp 97.5°F | Ht 69.0 in | Wt 206.8 lb

## 2015-01-25 DIAGNOSIS — I10 Essential (primary) hypertension: Secondary | ICD-10-CM

## 2015-01-25 DIAGNOSIS — E119 Type 2 diabetes mellitus without complications: Secondary | ICD-10-CM | POA: Diagnosis not present

## 2015-01-25 DIAGNOSIS — E1159 Type 2 diabetes mellitus with other circulatory complications: Secondary | ICD-10-CM | POA: Insufficient documentation

## 2015-01-25 HISTORY — DX: Essential (primary) hypertension: I10

## 2015-01-25 LAB — POCT CBC
GRANULOCYTE PERCENT: 73 % (ref 37–80)
HCT, POC: 49.3 % (ref 43.5–53.7)
Hemoglobin: 15.4 g/dL (ref 14.1–18.1)
LYMPH, POC: 1.7 (ref 0.6–3.4)
MCH, POC: 29.2 pg (ref 27–31.2)
MCHC: 31.3 g/dL — AB (ref 31.8–35.4)
MCV: 93.1 fL (ref 80–97)
MPV: 8.5 fL (ref 0–99.8)
PLATELET COUNT, POC: 186 10*3/uL (ref 142–424)
POC Granulocyte: 5.6 (ref 2–6.9)
POC LYMPH %: 21.8 % (ref 10–50)
RBC: 5.29 M/uL (ref 4.69–6.13)
RDW, POC: 12.6 %
WBC: 7.7 10*3/uL (ref 4.6–10.2)

## 2015-01-25 MED ORDER — OLMESARTAN MEDOXOMIL 20 MG PO TABS: 20.0000 mg | ORAL_TABLET | Freq: Every day | ORAL | Status: DC

## 2015-01-25 MED ORDER — GLIMEPIRIDE 2 MG PO TABS: 2.0000 mg | ORAL_TABLET | Freq: Every day | ORAL | Status: DC

## 2015-01-25 MED ORDER — LOSARTAN POTASSIUM 25 MG PO TABS: 25.0000 mg | ORAL_TABLET | Freq: Every day | ORAL | Status: DC

## 2015-01-25 NOTE — Telephone Encounter (Signed)
I told him that it probably wasn't covered in office - gave him a RX card to get Benicar for $25. Ok to change to Losartan 25mg  q day if he desires to do that instead.  Alverda Nazzaro A. Benjamin Stain PA-C

## 2015-01-25 NOTE — Telephone Encounter (Signed)
Presenter, broadcasting  They will cover Losarten 25,50,100 or Eprosartan 600, or Irbesarten 75,150,300 or Telmisartan 20 mg

## 2015-01-25 NOTE — Telephone Encounter (Signed)
Spoke with pt regarding RX Losartan RX sent into [facility]

## 2015-01-25 NOTE — Progress Notes (Signed)
   Subjective:    Patient ID: Adrian Tucker, male    DOB: [birth year], 54 y.o.   MRN: [id number]  HPI 54 y/o male with new diagnosis of DM type 2 presents for 4 week follow up after starting Metformin 1031m BID. He has been monitoring his FBS daily with ranges from 217-260. Hgb A1C was 12.4 at visit 4 weeks ago.   He has also been monitoring his blood pressure at home, which is also elevated today. His readings at home have been elevated ( approximately 130-140/90-101) He took Benicar in the past with resolution but was able to stop taking it with diet and weight changes.     Review of Systems  Constitutional: Negative.   HENT: Negative.   Endocrine: Negative for polydipsia and polyuria.  Musculoskeletal: Positive for arthralgias (joint pain in bilateral hands).  All other systems reviewed and are negative.      Objective:   Physical Exam  Constitutional: He is oriented to person, place, and time. He appears well-developed and well-nourished. No distress.  Pleasant    Cardiovascular: Normal rate, regular rhythm and normal heart sounds.  Exam reveals no gallop and no friction rub.   No murmur heard. Hypertensive   Pulmonary/Chest: Effort normal and breath sounds normal. No respiratory distress. He has no wheezes. He has no rales. He exhibits no tenderness.  Neurological: He is alert and oriented to person, place, and time. No cranial nerve deficit.  5 point discrimination WNL on bilateral feet  Skin: He is not diaphoretic.  Psychiatric: He has a normal mood and affect. His behavior is normal. Judgment and thought content normal.  Nursing note and vitals reviewed.         Assessment & Plan:  1. Type 2 diabetes mellitus without complication  - glimepiride (AMARYL) 2 MG tablet; Take 1 tablet (2 mg total) by mouth daily with breakfast.  Dispense: 30 tablet; Refill: 1  2. Essential hypertension  - olmesartan (BENICAR) 20 MG tablet; Take 1 tablet (20 mg total) by mouth daily.   Dispense: 30 tablet; Refill: 1 - POCT CBC - CMP14+EGFR  Diet modifications, exercise.   Monitor BP and FBS at home.      Continue all meds Labs pending Health Maintenance reviewed Diet and exercise encouraged RTO 2 months   Adrian Tucker A. GBenjamin StainPA-C

## 2015-01-26 LAB — CMP14+EGFR
A/G RATIO: 1.5 (ref 1.1–2.5)
ALBUMIN: 4.5 g/dL (ref 3.5–5.5)
ALK PHOS: 67 IU/L (ref 39–117)
ALT: 30 IU/L (ref 0–44)
AST: 22 IU/L (ref 0–40)
BILIRUBIN TOTAL: 0.7 mg/dL (ref 0.0–1.2)
BUN / CREAT RATIO: 27 — AB (ref 9–20)
BUN: 16 mg/dL (ref 6–24)
CHLORIDE: 100 mmol/L (ref 97–108)
CO2: 21 mmol/L (ref 18–29)
Calcium: 10 mg/dL (ref 8.7–10.2)
Creatinine, Ser: 0.6 mg/dL — ABNORMAL LOW (ref 0.76–1.27)
GFR, EST AFRICAN AMERICAN: 133 mL/min/{1.73_m2} (ref >59)
GFR, EST NON AFRICAN AMERICAN: 115 mL/min/{1.73_m2} (ref >59)
GLUCOSE: 237 mg/dL — AB (ref 65–99)
Globulin, Total: 3 g/dL (ref 1.5–4.5)
POTASSIUM: 5.2 mmol/L (ref 3.5–5.2)
Sodium: 138 mmol/L (ref 134–144)
Total Protein: 7.5 g/dL (ref 6.0–8.5)

## 2015-03-05 ENCOUNTER — Other Ambulatory Visit: Payer: Self-pay

## 2015-03-27 ENCOUNTER — Other Ambulatory Visit: Payer: Self-pay | Admitting: Physician Assistant

## 2015-03-28 ENCOUNTER — Encounter: Payer: Self-pay | Admitting: Physician Assistant

## 2015-03-28 ENCOUNTER — Ambulatory Visit (INDEPENDENT_AMBULATORY_CARE_PROVIDER_SITE_OTHER): Payer: BC Managed Care – PPO | Admitting: Physician Assistant

## 2015-03-28 VITALS — BP 152/96 | HR 70 | Temp 97.4°F | Ht 69.0 in | Wt 213.0 lb

## 2015-03-28 DIAGNOSIS — I10 Essential (primary) hypertension: Secondary | ICD-10-CM | POA: Diagnosis not present

## 2015-03-28 DIAGNOSIS — E119 Type 2 diabetes mellitus without complications: Secondary | ICD-10-CM | POA: Diagnosis not present

## 2015-03-28 LAB — POCT GLYCOSYLATED HEMOGLOBIN (HGB A1C): HEMOGLOBIN A1C: 8.7

## 2015-03-28 MED ORDER — GLIMEPIRIDE 4 MG PO TABS
4.0000 mg | ORAL_TABLET | Freq: Every day | ORAL | Status: DC
Start: 2015-03-28 — End: 2015-04-30

## 2015-03-28 MED ORDER — LOSARTAN POTASSIUM 50 MG PO TABS: ORAL_TABLET | ORAL | Status: DC

## 2015-03-28 NOTE — Progress Notes (Signed)
   Subjective:    Patient ID: Adrian Tucker, male    DOB: 1960-12-28, 54 y.o.   MRN: [id number]  HPI 54 y/o male presents for 2 months follow up after being diagnosed with htn and DM type 2, non insulin dependent. He was not able to get the Benicar d/t cost so he has been taking Losaran 25 mg with minimal decrease in BP readings.   FBS have ranged from 170-200 with treatment of Metformin 1000 mg bid and Glimepiride 2mg  daily.     Review of Systems  Constitutional: Negative.   HENT: Negative.   Eyes: Negative.        Last eye exam 2 months ago   Respiratory: Negative for cough, shortness of breath and wheezing.   Cardiovascular: Negative for chest pain, palpitations and leg swelling.  Gastrointestinal: Negative.   Endocrine: Negative for polydipsia, polyphagia and polyuria.  Genitourinary: Negative.   Musculoskeletal: Negative.   Skin: Negative.   Neurological: Negative.   Psychiatric/Behavioral: Negative.        Objective:   Physical Exam  Constitutional: He is oriented to person, place, and time. He appears well-developed and well-nourished. No distress.  HENT:  Head: Normocephalic.  Cardiovascular: Normal rate, regular rhythm, normal heart sounds and intact distal pulses.  Exam reveals no gallop and no friction rub.   No murmur heard. Pulmonary/Chest: Effort normal and breath sounds normal. No respiratory distress. He has no wheezes. He has no rales. He exhibits no tenderness.  Musculoskeletal: He exhibits no edema or tenderness.  Neurological: He is alert and oriented to person, place, and time.  Skin: He is not diaphoretic.  Psychiatric: He has a normal mood and affect. His behavior is normal. Judgment and thought content normal.  Nursing note and vitals reviewed.         Assessment & Plan:  1. Essential hypertension Increase dosage d/t BP continuing to be elevated  - losartan (COZAAR) 50 MG tablet; Take 1 tablet BID. If pm reading is below 120/80, do not take  pm dose  Dispense: 60 tablet; Refill: 1 - CMP14+EGFR  2. Type 2 diabetes mellitus without complication - Continue Metformin 1000mg  BID increase Glimepiride  - glimepiride (AMARYL) 4 MG tablet; Take 1 tablet (4 mg total) by mouth daily before breakfast.  Dispense: 30 tablet; Refill: 1 - Hemoglobin A1c  Have patient follow up with clinical pharmacist for diabetic mgmt.    Continue all meds Labs pending Health Maintenance reviewed Diet and exercise encouraged RTO {1 month for reassessment   Jayvien Rowlette A. Benjamin Stain PA-C

## 2015-03-29 LAB — CMP14+EGFR
ALBUMIN: 4.5 g/dL (ref 3.5–5.5)
ALK PHOS: 62 IU/L (ref 39–117)
ALT: 38 IU/L (ref 0–44)
AST: 25 IU/L (ref 0–40)
Albumin/Globulin Ratio: 1.7 (ref 1.1–2.5)
BUN / CREAT RATIO: 27 — AB (ref 9–20)
BUN: 21 mg/dL (ref 6–24)
Bilirubin Total: 0.5 mg/dL (ref 0.0–1.2)
CO2: 24 mmol/L (ref 18–29)
Calcium: 9.4 mg/dL (ref 8.7–10.2)
Chloride: 101 mmol/L (ref 97–108)
Creatinine, Ser: 0.78 mg/dL (ref 0.76–1.27)
GFR, EST AFRICAN AMERICAN: 119 mL/min/{1.73_m2} (ref >59)
GFR, EST NON AFRICAN AMERICAN: 103 mL/min/{1.73_m2} (ref >59)
Globulin, Total: 2.7 g/dL (ref 1.5–4.5)
Glucose: 189 mg/dL — ABNORMAL HIGH (ref 65–99)
POTASSIUM: 5.5 mmol/L — AB (ref 3.5–5.2)
SODIUM: 140 mmol/L (ref 134–144)
TOTAL PROTEIN: 7.2 g/dL (ref 6.0–8.5)

## 2015-04-16 ENCOUNTER — Ambulatory Visit (INDEPENDENT_AMBULATORY_CARE_PROVIDER_SITE_OTHER): Payer: BC Managed Care – PPO | Admitting: Pharmacist

## 2015-04-16 ENCOUNTER — Encounter: Payer: Self-pay | Admitting: Pharmacist

## 2015-04-16 VITALS — BP 150/90 | HR 72 | Ht 70.0 in | Wt 217.8 lb

## 2015-04-16 DIAGNOSIS — E782 Mixed hyperlipidemia: Secondary | ICD-10-CM | POA: Diagnosis not present

## 2015-04-16 DIAGNOSIS — E669 Obesity, unspecified: Secondary | ICD-10-CM

## 2015-04-16 DIAGNOSIS — E119 Type 2 diabetes mellitus without complications: Secondary | ICD-10-CM

## 2015-04-16 DIAGNOSIS — I10 Essential (primary) hypertension: Secondary | ICD-10-CM | POA: Diagnosis not present

## 2015-04-16 DIAGNOSIS — E875 Hyperkalemia: Secondary | ICD-10-CM | POA: Diagnosis not present

## 2015-04-16 MED ORDER — VALSARTAN 160 MG PO TABS: 160.0000 mg | ORAL_TABLET | Freq: Every day | ORAL | Status: DC

## 2015-04-16 MED ORDER — DULAGLUTIDE 0.75 MG/0.5ML ~~LOC~~ SOAJ: 1.0000 | SUBCUTANEOUS | Status: DC

## 2015-04-16 NOTE — Patient Instructions (Signed)
Diabetes and Standards of Medical Care   Diabetes is complicated. You may find that your diabetes team includes a dietitian, nurse, diabetes educator, eye doctor, and more. To help everyone know what is going on and to help you get the care you deserve, the following schedule of care was developed to help keep you on track. Below are the tests, exams, vaccines, medicines, education, and plans you will need.  Blood Glucose Goals Prior to meals = 80 - 130 Within 2 hours of the start of a meal = less than 180  HbA1c test (goal is less than 7.0% - your last value was 8.7%) This test shows how well you have controlled your glucose over the past 2 to 3 months. It is used to see if your diabetes management plan needs to be adjusted.   It is performed at least 2 times a year if you are meeting treatment goals.  It is performed 4 times a year if therapy has changed or if you are not meeting treatment goals.  Blood pressure test  This test is performed at every routine medical visit. The goal is less than 140/90 mmHg for most people, but 130/80 mmHg in some cases. Ask your health care provider about your goal.  Dental exam  Follow up with the dentist regularly.  Eye exam  If you are diagnosed with type 1 diabetes as a child, get an exam upon reaching the age of 10 years or older and have had diabetes for 3 to 5 years. Yearly eye exams are recommended after that initial eye exam.  If you are diagnosed with type 1 diabetes as an adult, get an exam within 5 years of diagnosis and then yearly.  If you are diagnosed with type 2 diabetes, get an exam as soon as possible after the diagnosis and then yearly.  Foot care exam  Visual foot exams are performed at every routine medical visit. The exams check for cuts, injuries, or other problems with the feet.  A comprehensive foot exam should be done yearly. This includes visual inspection as well as assessing foot pulses and testing for loss of  sensation.  Check your feet nightly for cuts, injuries, or other problems with your feet. Tell your health care provider if anything is not healing.  Kidney function test (urine microalbumin)  This test is performed once a year.  Type 1 diabetes: The first test is performed 5 years after diagnosis.  Type 2 diabetes: The first test is performed at the time of diagnosis.  A serum creatinine and estimated glomerular filtration rate (eGFR) test is done once a year to assess the level of chronic kidney disease (CKD), if present.  Lipid profile (cholesterol, HDL, LDL, triglycerides)  Performed every 5 years for most people.  The goal for LDL is less than 100 mg/dL. If you are at high risk, the goal is less than 70 mg/dL.  The goal for HDL is 40 mg/dL to 50 mg/dL for men and 50 mg/dL to 60 mg/dL for women. An HDL cholesterol of 60 mg/dL or higher gives some protection against heart disease.  The goal for triglycerides is less than 150 mg/dL.  Influenza vaccine, pneumococcal vaccine, and hepatitis B vaccine  The influenza vaccine is recommended yearly.  The pneumococcal vaccine is generally given once in a lifetime. However, there are some instances when another vaccination is recommended. Check with your health care provider.  The hepatitis B vaccine is also recommended for adults with diabetes.    Diabetes self-management education  Education is recommended at diagnosis and ongoing as needed.  Treatment plan  Your treatment plan is reviewed at every medical visit.  Document Released: 06/22/2009 Document Revised: 04/27/2013 Document Reviewed: 01/25/2013 [employer] Patient Information 2014 [employer].   

## 2015-04-16 NOTE — Progress Notes (Signed)
Subjective:    Adrian Tucker is a 54 y.o. male who presents for an initial evaluation of Type 2 diabetes mellitus.  [name] was initially diagnosed with Type 2 DM about 8 years ago.  He wsa started on metformin and lost about 80 lbs.  He stopped all diabetes medications and was DM was controlled for 2-3 years without any medications.  However, this past April his A1c increased to 12.5%.  He restarted medications but when A1c was checked in July 2016 it was still over goal at 8.7%.  Patient reports that home BG readings are still elevated (only checking fasting) and have ranged from 160 to 185 Current symptoms/problems include hyperglycemia and have been improving. Symptoms have been present for 5 months.   Known diabetic complications: none Cardiovascular risk factors: diabetes mellitus, hypertension, male gender, obesity (BMI >= 30 kg/m2) and sedentary lifestyle Current diabetic medications include metformin $RemoveBeforeDEI'1000mg'UTsLqJCxoWsUaMuJ$  1 tablet bid; glimepiride $RemoveBeforeDE'4mg'FySenunnsGbjIbb$  1 tablet daily.   Eye exam current (within one year): yes Weight trend: increasing steadily - patient's weight has increased 15 lbs since starting glimepiride Prior visit with dietician: yes - patient states that he know well what he should and should not eat Current diet: in general, a "healthy" diet  , he continues to limit serving sizes Current exercise: none - work is active - outside and inside work  Current monitoring regimen: checks BG dail Any episodes of hypoglycemia? no  Is He on ACE inhibitor or angiotensin II receptor blocker?  Yes    losartan (Cozaar)$RemoveBeforeDEI'50mg'bCPVAXtkoCnytOge$   bid  The following portions of the patient's history were reviewed and updated as appropriate: allergies, current medications, past family history, past medical history, past social history, past surgical history and problem list.   Objective:    BP 150/90 mmHg  Pulse 72  Ht $R'5\' 10"'Zo$  (1.778 m)  Wt 217 lb 12 oz (98.771 kg)  BMI 31.24 kg/m2   Lab Review GLUCOSE (mg/dL)   Date Value  03/28/2015 189*  01/25/2015 237*   GLUCOSE, BLD (mg/dL)  Date Value  11/29/2011 246*  11/28/2011 400*  11/20/2011 267*   CO2  Date Value  03/28/2015 24 mmol/L  01/25/2015 21 mmol/L  11/29/2011 28 mEq/L   BUN (mg/dL)  Date Value  03/28/2015 21  01/25/2015 16  11/29/2011 18  11/20/2011 17   CREATININE, SER (mg/dL)  Date Value  03/28/2015 0.78  01/25/2015 0.60*  11/29/2011 0.74   A1c = 8.7% (03/28/2015)  And was 12.5% (12/28/2014)  Potassium was elevated on last 2 checks - was 5. 5 (03/28/2015)  Last lipids (done through work) - [facility] 12/19/2014 TC= 182 Tg = 288 LDL - 88 HDL = 36  Assessment:    Diabetes Mellitus type II, under improving but not at goal control.   HTN - not controlled with increase in losartan Hypertriglyceridemia - likely secondary to uncontrolled DM Hyperkalemia   Plan:    1.  Rx changes:  change losartan to valsartan $RemoveBefo'160mg'UKvdJoPKStT$  take 1 tablet daily   Start Trulicity 0.$RemoveBeforeDEI'75mg'ohMDIeRqSoWirIrJ$ /ml - inject one syringeful weekly (gave #4 samples to start - depending on insurance coverage might have to change to preferred agent)  2.  Education: Reviewed 'ABCs' of diabetes management (respective goals in parentheses):  A1C (<7), blood pressure (<130/80), and cholesterol (LDL <100).  3.  Compliance at present is estimated to be good. Efforts to improve compliance (if necessary) will be directed at dietary modifications: reviewed CHO counting diet today, increased exercise and regular blood sugar monitoring: 1 to  2  times daily.  4.   Orders Placed This Encounter  Procedures  . Lipid panel  . CMP14+EGFR  . Microalbumin / creatinine urine ratio    5.  RTC to see PCP as scheduled 04/30/2015  Cherre Robins, PharmD, CPP, CDE

## 2015-04-17 LAB — CMP14+EGFR
A/G RATIO: 1.4 (ref 1.1–2.5)
ALBUMIN: 4 g/dL (ref 3.5–5.5)
ALT: 37 IU/L (ref 0–44)
AST: 23 IU/L (ref 0–40)
Alkaline Phosphatase: 57 IU/L (ref 39–117)
BILIRUBIN TOTAL: 0.4 mg/dL (ref 0.0–1.2)
BUN / CREAT RATIO: 21 — AB (ref 9–20)
BUN: 15 mg/dL (ref 6–24)
CALCIUM: 9.2 mg/dL (ref 8.7–10.2)
CO2: 22 mmol/L (ref 18–29)
CREATININE: 0.7 mg/dL — AB (ref 0.76–1.27)
Chloride: 103 mmol/L (ref 97–108)
GFR calc Af Amer: 125 mL/min/{1.73_m2} (ref >59)
GFR calc non Af Amer: 108 mL/min/{1.73_m2} (ref >59)
GLOBULIN, TOTAL: 2.8 g/dL (ref 1.5–4.5)
GLUCOSE: 191 mg/dL — AB (ref 65–99)
Potassium: 4.9 mmol/L (ref 3.5–5.2)
Sodium: 139 mmol/L (ref 134–144)
TOTAL PROTEIN: 6.8 g/dL (ref 6.0–8.5)

## 2015-04-17 LAB — LIPID PANEL
CHOLESTEROL TOTAL: 149 mg/dL (ref 100–199)
Chol/HDL Ratio: 3.4 ratio units (ref 0.0–5.0)
HDL: 44 mg/dL (ref >39)
LDL Calculated: 90 mg/dL (ref 0–99)
Triglycerides: 75 mg/dL (ref 0–149)
VLDL Cholesterol Cal: 15 mg/dL (ref 5–40)

## 2015-04-17 LAB — MICROALBUMIN / CREATININE URINE RATIO
CREATININE, UR: 79.5 mg/dL
MICROALB/CREAT RATIO: 14.5 mg/g{creat} (ref 0.0–30.0)
Microalbumin, Urine: 11.5 ug/mL

## 2015-04-30 ENCOUNTER — Ambulatory Visit (INDEPENDENT_AMBULATORY_CARE_PROVIDER_SITE_OTHER): Payer: BC Managed Care – PPO | Admitting: Physician Assistant

## 2015-04-30 ENCOUNTER — Encounter: Payer: Self-pay | Admitting: Physician Assistant

## 2015-04-30 VITALS — BP 155/86 | HR 60 | Temp 97.4°F | Ht 70.0 in | Wt 218.0 lb

## 2015-04-30 DIAGNOSIS — E119 Type 2 diabetes mellitus without complications: Secondary | ICD-10-CM

## 2015-04-30 MED ORDER — DULAGLUTIDE 0.75 MG/0.5ML ~~LOC~~ SOAJ: 1.0000 | SUBCUTANEOUS | Status: DC

## 2015-04-30 NOTE — Progress Notes (Signed)
   Subjective:    Patient ID: Adrian Tucker, male    DOB: 1961/01/30, 54 y.o.   MRN: [id number]  HPI 54 y/o male with DM presents for follow up of blood sugars after changing to Trulicity. He met with Tammy Eckerd, Clinical pharm D last month and was unsure if he was to continue the Glimiperide or stop it when beginning the Trulicity. He just started the Trulicity last night. His fbs was 168 this am.     Review of Systems  Constitutional: Negative.   HENT: Negative.   Eyes: Negative.   Respiratory: Negative.   Cardiovascular: Negative.   Gastrointestinal: Negative.   Endocrine: Negative.   Genitourinary: Negative.   Musculoskeletal: Negative.   Skin: Negative.   Allergic/Immunologic: Negative.   Neurological: Negative.   Hematological: Negative.   Psychiatric/Behavioral: Negative.        Objective:   Physical Exam  Constitutional: He is oriented to person, place, and time. He appears well-developed and well-nourished.  Cardiovascular: Normal rate, regular rhythm, normal heart sounds and intact distal pulses.  Exam reveals no gallop and no friction rub.   No murmur heard. Pulmonary/Chest: Effort normal and breath sounds normal. No respiratory distress. He has no wheezes. He has no rales. He exhibits no tenderness.  Musculoskeletal: Normal range of motion. He exhibits no edema or tenderness.  Neurological: He is alert and oriented to person, place, and time.  Psychiatric: He has a normal mood and affect. His behavior is normal. Judgment and thought content normal.  Nursing note and vitals reviewed.         Assessment & Plan:  1. Type 2 diabetes mellitus without complication - Stop Glimipiride  - Continue Dulaglutide (TRULICITY) 4.03 BJ/9.5FK SOPN; Inject 1 Syringe into the skin once a week.  Dispense: 2 mL; Refill: 2   RTC 3 months   Jayvion Stefanski A. Benjamin Stain PA-C

## 2015-05-02 ENCOUNTER — Telehealth: Payer: Self-pay | Admitting: Physician Assistant

## 2015-05-02 NOTE — Telephone Encounter (Signed)
-----   Message from [place], [place] sent at 04/30/2015  9:17 AM EDT ----- He should hold the glimepiride when he starts Trulicity.  If he sees that BG starts to increase (over 180) he can call me and we can discuss if he needs to restart the glimepiride.    Tammy  ----- Message -----    From: Adella Nissen, PA-C    Sent: 04/30/2015   8:42 AM      To: Cherre Robins, PHARMD  Tammy,  I saw this patient this morning. He just started the Trulicity last night. His FBS was 168 this morning. He was under the impression that he was supposed to stop his Glimipiride but I don't see that in the note. Did you want him to continue or stop it when you started the insulin?   Thanks [employer]

## 2015-05-07 NOTE — Telephone Encounter (Signed)
Patient notified to hold glimepiride by phone - left message

## 2015-05-10 DIAGNOSIS — R0602 Shortness of breath: Secondary | ICD-10-CM

## 2015-05-10 HISTORY — DX: Shortness of breath: R06.02

## 2015-05-23 ENCOUNTER — Other Ambulatory Visit: Payer: Self-pay | Admitting: Physician Assistant

## 2015-05-25 ENCOUNTER — Encounter: Payer: Self-pay | Admitting: *Deleted

## 2015-05-25 ENCOUNTER — Other Ambulatory Visit: Payer: Self-pay | Admitting: Physician Assistant

## 2015-05-25 DIAGNOSIS — E119 Type 2 diabetes mellitus without complications: Secondary | ICD-10-CM

## 2015-05-25 MED ORDER — GLIMEPIRIDE 4 MG PO TABS: ORAL_TABLET | ORAL | Status: DC

## 2015-05-25 MED ORDER — DULAGLUTIDE 0.75 MG/0.5ML ~~LOC~~ SOAJ: 1.0000 | SUBCUTANEOUS | Status: DC

## 2015-05-25 NOTE — Telephone Encounter (Signed)
done

## 2015-06-26 ENCOUNTER — Encounter: Payer: Self-pay | Admitting: Physician Assistant

## 2015-06-26 ENCOUNTER — Telehealth: Payer: Self-pay | Admitting: Family Medicine

## 2015-06-27 MED ORDER — VALSARTAN 160 MG PO TABS: 160.0000 mg | ORAL_TABLET | Freq: Every day | ORAL | Status: DC

## 2015-06-27 NOTE — Telephone Encounter (Signed)
done

## 2015-08-01 ENCOUNTER — Ambulatory Visit: Payer: BC Managed Care – PPO | Admitting: Physician Assistant

## 2015-08-01 ENCOUNTER — Ambulatory Visit: Payer: BC Managed Care – PPO | Admitting: Family

## 2015-08-13 ENCOUNTER — Encounter: Payer: Self-pay | Admitting: Family

## 2015-08-13 ENCOUNTER — Ambulatory Visit (INDEPENDENT_AMBULATORY_CARE_PROVIDER_SITE_OTHER): Payer: BC Managed Care – PPO | Admitting: Family

## 2015-08-13 VITALS — BP 145/89 | HR 69 | Temp 98.0°F | Ht 70.0 in | Wt 221.4 lb

## 2015-08-13 DIAGNOSIS — K219 Gastro-esophageal reflux disease without esophagitis: Secondary | ICD-10-CM

## 2015-08-13 DIAGNOSIS — M1611 Unilateral primary osteoarthritis, right hip: Secondary | ICD-10-CM | POA: Diagnosis not present

## 2015-08-13 DIAGNOSIS — Z1211 Encounter for screening for malignant neoplasm of colon: Secondary | ICD-10-CM

## 2015-08-13 DIAGNOSIS — Z1159 Encounter for screening for other viral diseases: Secondary | ICD-10-CM | POA: Diagnosis not present

## 2015-08-13 DIAGNOSIS — I10 Essential (primary) hypertension: Secondary | ICD-10-CM

## 2015-08-13 DIAGNOSIS — E669 Obesity, unspecified: Secondary | ICD-10-CM

## 2015-08-13 DIAGNOSIS — E119 Type 2 diabetes mellitus without complications: Secondary | ICD-10-CM

## 2015-08-13 LAB — POCT GLYCOSYLATED HEMOGLOBIN (HGB A1C): Hemoglobin A1C: 7.1

## 2015-08-13 MED ORDER — VALSARTAN-HYDROCHLOROTHIAZIDE 160-25 MG PO TABS: 1.0000 | ORAL_TABLET | Freq: Every day | ORAL | Status: DC

## 2015-08-13 MED ORDER — OMEPRAZOLE 20 MG PO CPDR: 20.0000 mg | DELAYED_RELEASE_CAPSULE | Freq: Every day | ORAL | Status: DC

## 2015-08-13 NOTE — Patient Instructions (Signed)

## 2015-08-13 NOTE — Progress Notes (Signed)
Subjective:    Patient ID: Adrian Tucker, male    DOB: 25-Mar-1961, 54 y.o.   MRN: [id number]  PT presents to the office today for chronic follow up.  Diabetes He presents for his follow-up diabetic visit. He has type 2 diabetes mellitus. His disease course has been fluctuating. There are no hypoglycemic associated symptoms. Pertinent negatives for hypoglycemia include no confusion or headaches. Pertinent negatives for diabetes include no blurred vision, no foot paresthesias, no foot ulcerations and no visual change. Pertinent negatives for hypoglycemia complications include no blackouts and no hospitalization. Symptoms are improving. Pertinent negatives for diabetic complications include no CVA, heart disease, nephropathy or peripheral neuropathy. Risk factors for coronary artery disease include diabetes mellitus, hypertension, male sex, obesity and stress. Current diabetic treatment includes oral agent (triple therapy). He is compliant with treatment all of the time. He is following a generally healthy diet. His breakfast blood glucose range is generally 110-130 mg/dl. An ACE inhibitor/angiotensin II receptor blocker is being taken. Eye exam is current (Aug 2016).  Hypertension This is a chronic problem. The current episode started more than 1 year ago. The problem has been waxing and waning since onset. The problem is uncontrolled. Pertinent negatives include no blurred vision, headaches, palpitations, peripheral edema or shortness of breath. Risk factors for coronary artery disease include diabetes mellitus, male gender and obesity. Past treatments include angiotensin blockers. There is no history of kidney disease, CAD/MI, CVA, heart failure or a thyroid problem. There is no history of sleep apnea.      Review of Systems  Constitutional: Negative.   HENT: Negative.   Eyes: Negative for blurred vision.  Respiratory: Negative.  Negative for shortness of breath.   Cardiovascular: Negative.   Negative for palpitations.  Gastrointestinal: Negative.   Endocrine: Negative.   Genitourinary: Negative.   Musculoskeletal: Negative.   Neurological: Negative.  Negative for headaches.  Hematological: Negative.   Psychiatric/Behavioral: Negative.  Negative for confusion.  All other systems reviewed and are negative.      Objective:   Physical Exam  Constitutional: He is oriented to person, place, and time. He appears well-developed and well-nourished. No distress.  HENT:  Head: Normocephalic.  Right Ear: External ear normal.  Left Ear: External ear normal.  Nose: Nose normal.  Mouth/Throat: Oropharynx is clear and moist.  Eyes: Pupils are equal, round, and reactive to light. Right eye exhibits no discharge. Left eye exhibits no discharge.  Neck: Normal range of motion. Neck supple. No thyromegaly present.  Cardiovascular: Normal rate, regular rhythm, normal heart sounds and intact distal pulses.   No murmur heard. Pulmonary/Chest: Effort normal and breath sounds normal. No respiratory distress. He has no wheezes.  Abdominal: Soft. Bowel sounds are normal. He exhibits no distension. There is no tenderness.  Musculoskeletal: Normal range of motion. He exhibits no edema or tenderness.  Neurological: He is alert and oriented to person, place, and time. He has normal reflexes. No cranial nerve deficit.  Skin: Skin is warm and dry. No rash noted. No erythema.  Psychiatric: He has a normal mood and affect. His behavior is normal. Judgment and thought content normal.  Vitals reviewed.   BP 145/89 mmHg  Pulse 69  Temp(Src) 98 F (36.7 C) (Oral)  Ht $R'5\' 10"'Rd$  (1.778 m)  Wt 221 lb 6.4 oz (100.426 kg)  BMI 31.77 kg/m2       Assessment & Plan:  1. Type 2 diabetes mellitus without complication, without long-term current use of insulin (HCC) - POCT  glycosylated hemoglobin (Hb A1C) - CMP14+EGFR  2. Essential hypertension -Added HCTZ 25 mg today -Daily blood pressure log given with  instructions on how to fill out and told to bring to next visit -Dash diet information given -Exercise encouraged - Stress Management  -Continue current meds -RTO in 2 weeks - CMP14+EGFR - valsartan-hydrochlorothiazide (DIOVAN HCT) 160-25 MG tablet; Take 1 tablet by mouth daily.  Dispense: 90 tablet; Refill: 1  3. Primary osteoarthritis of right hip - CMP14+EGFR  4. Obesity - CMP14+EGFR  5. Need for hepatitis C screening test - CMP14+EGFR - Hepatitis C antibody  6. Encounter for screening colonoscopy - CMP14+EGFR - Ambulatory referral to Gastroenterology  7. Gastroesophageal reflux disease, esophagitis presence not specified - omeprazole (PRILOSEC) 20 MG capsule; Take 1 capsule (20 mg total) by mouth daily.  Dispense: 30 capsule; Refill: 3   Continue all meds Labs pending Health Maintenance reviewed Diet and exercise encouraged RTO 2 weeks to recheck HTN  Evelina Dun, FNP

## 2015-08-14 ENCOUNTER — Encounter: Payer: Self-pay | Admitting: Internal Medicine

## 2015-08-14 LAB — CMP14+EGFR
A/G RATIO: 1.5 (ref 1.1–2.5)
ALBUMIN: 4.2 g/dL (ref 3.5–5.5)
ALK PHOS: 59 IU/L (ref 39–117)
ALT: 41 IU/L (ref 0–44)
AST: 30 IU/L (ref 0–40)
BUN / CREAT RATIO: 19 (ref 9–20)
BUN: 15 mg/dL (ref 6–24)
Bilirubin Total: 0.5 mg/dL (ref 0.0–1.2)
CALCIUM: 9.3 mg/dL (ref 8.7–10.2)
CO2: 23 mmol/L (ref 18–29)
CREATININE: 0.8 mg/dL (ref 0.76–1.27)
Chloride: 101 mmol/L (ref 97–106)
GFR calc Af Amer: 118 mL/min/{1.73_m2} (ref >59)
GFR, EST NON AFRICAN AMERICAN: 102 mL/min/{1.73_m2} (ref >59)
GLOBULIN, TOTAL: 2.8 g/dL (ref 1.5–4.5)
Glucose: 155 mg/dL — ABNORMAL HIGH (ref 65–99)
POTASSIUM: 5.2 mmol/L (ref 3.5–5.2)
SODIUM: 138 mmol/L (ref 136–144)
Total Protein: 7 g/dL (ref 6.0–8.5)

## 2015-08-14 LAB — HEPATITIS C ANTIBODY: Hep C Virus Ab: <0.1 s/co ratio (ref 0.0–0.9)

## 2015-08-22 ENCOUNTER — Encounter: Payer: Self-pay | Admitting: *Deleted

## 2015-09-04 ENCOUNTER — Encounter: Payer: Self-pay | Admitting: Family

## 2015-09-04 ENCOUNTER — Ambulatory Visit (INDEPENDENT_AMBULATORY_CARE_PROVIDER_SITE_OTHER): Payer: BC Managed Care – PPO

## 2015-09-04 ENCOUNTER — Ambulatory Visit (INDEPENDENT_AMBULATORY_CARE_PROVIDER_SITE_OTHER): Payer: BC Managed Care – PPO | Admitting: Family

## 2015-09-04 VITALS — BP 128/85 | HR 72 | Temp 97.7°F | Ht 70.0 in | Wt 222.8 lb

## 2015-09-04 DIAGNOSIS — E119 Type 2 diabetes mellitus without complications: Secondary | ICD-10-CM

## 2015-09-04 DIAGNOSIS — R0602 Shortness of breath: Secondary | ICD-10-CM

## 2015-09-04 DIAGNOSIS — R059 Cough, unspecified: Secondary | ICD-10-CM

## 2015-09-04 DIAGNOSIS — Z794 Long term (current) use of insulin: Secondary | ICD-10-CM

## 2015-09-04 DIAGNOSIS — E669 Obesity, unspecified: Secondary | ICD-10-CM | POA: Diagnosis not present

## 2015-09-04 DIAGNOSIS — R05 Cough: Secondary | ICD-10-CM | POA: Diagnosis not present

## 2015-09-04 DIAGNOSIS — K219 Gastro-esophageal reflux disease without esophagitis: Secondary | ICD-10-CM | POA: Diagnosis not present

## 2015-09-04 DIAGNOSIS — I10 Essential (primary) hypertension: Secondary | ICD-10-CM

## 2015-09-04 MED ORDER — OMEPRAZOLE 20 MG PO CPDR: 20.0000 mg | DELAYED_RELEASE_CAPSULE | Freq: Every day | ORAL | Status: DC

## 2015-09-04 MED ORDER — BUDESONIDE-FORMOTEROL FUMARATE 80-4.5 MCG/ACT IN AERO: 2.0000 | INHALATION_SPRAY | Freq: Two times a day (BID) | RESPIRATORY_TRACT | Status: DC

## 2015-09-04 MED ORDER — METFORMIN HCL 1000 MG PO TABS: 1000.0000 mg | ORAL_TABLET | Freq: Two times a day (BID) | ORAL | Status: DC

## 2015-09-04 MED ORDER — GLIMEPIRIDE 4 MG PO TABS: ORAL_TABLET | ORAL | Status: DC

## 2015-09-04 MED ORDER — DULAGLUTIDE 0.75 MG/0.5ML ~~LOC~~ SOAJ: 1.0000 | SUBCUTANEOUS | Status: DC

## 2015-09-04 NOTE — Patient Instructions (Signed)
Asthma, Adult Asthma is a recurring condition in which the airways tighten and narrow. Asthma can make it difficult to breathe. It can cause coughing, wheezing, and shortness of breath. Asthma episodes, also called asthma attacks, range from minor to life-threatening. Asthma cannot be cured, but medicines and lifestyle changes can help control it. CAUSES Asthma is believed to be caused by inherited (genetic) and environmental factors, but its exact cause is unknown. Asthma may be triggered by allergens, lung infections, or irritants in the air. Asthma triggers are different for each person. Common triggers include:   Animal dander.  Dust mites.  Cockroaches.  Pollen from trees or grass.  Mold.  Smoke.  Air pollutants such as dust, household cleaners, hair sprays, aerosol sprays, paint fumes, strong chemicals, or strong odors.  Cold air, weather changes, and winds (which increase molds and pollens in the air).  Strong emotional expressions such as crying or laughing hard.  Stress.  Certain medicines (such as aspirin) or types of drugs (such as beta-blockers).  Sulfites in foods and drinks. Foods and drinks that may contain sulfites include dried fruit, potato chips, and sparkling grape juice.  Infections or inflammatory conditions such as the flu, a cold, or an inflammation of the nasal membranes (rhinitis).  Gastroesophageal reflux disease (GERD).  Exercise or strenuous activity. SYMPTOMS Symptoms may occur immediately after asthma is triggered or many hours later. Symptoms include:  Wheezing.  Excessive nighttime or early morning coughing.  Frequent or severe coughing with a common cold.  Chest tightness.  Shortness of breath. DIAGNOSIS  The diagnosis of asthma is made by a review of your medical history and a physical exam. Tests may also be performed. These may include:  Lung function studies. These tests show how much air you breathe in and out.  Allergy  tests.  Imaging tests such as X-rays. TREATMENT  Asthma cannot be cured, but it can usually be controlled. Treatment involves identifying and avoiding your asthma triggers. It also involves medicines. There are 2 classes of medicine used for asthma treatment:   Controller medicines. These prevent asthma symptoms from occurring. They are usually taken every day.  Reliever or rescue medicines. These quickly relieve asthma symptoms. They are used as needed and provide short-term relief. Your health care provider will help you create an asthma action plan. An asthma action plan is a written plan for managing and treating your asthma attacks. It includes a list of your asthma triggers and how they may be avoided. It also includes information on when medicines should be taken and when their dosage should be changed. An action plan may also involve the use of a device called a peak flow meter. A peak flow meter measures how well the lungs are working. It helps you monitor your condition. HOME CARE INSTRUCTIONS   Take medicines only as directed by your health care provider. Speak with your health care provider if you have questions about how or when to take the medicines.  Use a peak flow meter as directed by your health care provider. Record and keep track of readings.  Understand and use the action plan to help minimize or stop an asthma attack without needing to seek medical care.  Control your home environment in the following ways to help prevent asthma attacks:  Do not smoke. Avoid being exposed to secondhand smoke.  Change your heating and air conditioning filter regularly.  Limit your use of fireplaces and wood stoves.  Get rid of pests (such as roaches   and mice) and their droppings.  Throw away plants if you see mold on them.  Clean your floors and dust regularly. Use unscented cleaning products.  Try to have someone else vacuum for you regularly. Stay out of rooms while they are  being vacuumed and for a short while afterward. If you vacuum, use a dust mask from a hardware store, a double-layered or microfilter vacuum cleaner bag, or a vacuum cleaner with a HEPA filter.  Replace carpet with wood, tile, or vinyl flooring. Carpet can trap dander and dust.  Use allergy-proof pillows, mattress covers, and box spring covers.  Wash bed sheets and blankets every week in hot water and dry them in a dryer.  Use blankets that are made of polyester or cotton.  Clean bathrooms and kitchens with bleach. If possible, have someone repaint the walls in these rooms with mold-resistant paint. Keep out of the rooms that are being cleaned and painted.  Wash hands frequently. SEEK MEDICAL CARE IF:   You have wheezing, shortness of breath, or a cough even if taking medicine to prevent attacks.  The colored mucus you cough up (sputum) is thicker than usual.  Your sputum changes from clear or white to yellow, green, gray, or bloody.  You have any problems that may be related to the medicines you are taking (such as a rash, itching, swelling, or trouble breathing).  You are using a reliever medicine more than 2-3 times per week.  Your peak flow is still at 50-79% of your personal best after following your action plan for 1 hour.  You have a fever. SEEK IMMEDIATE MEDICAL CARE IF:   You seem to be getting worse and are unresponsive to treatment during an asthma attack.  You are short of breath even at rest.  You get short of breath when doing very little physical activity.  You have difficulty eating, drinking, or talking due to asthma symptoms.  You develop chest pain.  You develop a fast heartbeat.  You have a bluish color to your lips or fingernails.  You are light-headed, dizzy, or faint.  Your peak flow is less than 50% of your personal best.   This information is not intended to replace advice given to you by your health care provider. Make sure you discuss any  questions you have with your health care provider.   Document Released: 08/25/2005 Document Revised: 05/16/2015 Document Reviewed: 03/24/2013 Elsevier Interactive Patient Education 2016 [employer]  

## 2015-09-04 NOTE — Progress Notes (Signed)
Subjective:    Patient ID: Adrian Tucker, male    DOB: 1961-06-26, 54 y.o.   MRN: [id number]  Pt presents to the office today to recheck HTN. PT's BP is at goal today!1 Pt was also started on a PPI for GERD. PT states he was have a dry intermittent cough and SOB while talking or if cold air hits him. PT denies any wheezing, chest pain, productive cough, or histoyr of asthma or COPD. PT states the PPI has not improved his cough or intermittent SOB.  Hypertension This is a chronic problem. The current episode started more than 1 year ago. The problem has been resolved since onset. The problem is controlled. Pertinent negatives include no palpitations, peripheral edema or shortness of breath. Risk factors for coronary artery disease include diabetes mellitus, male gender and obesity. Past treatments include angiotensin blockers. There is no history of kidney disease, CAD/MI, heart failure or a thyroid problem. There is no history of sleep apnea.      Review of Systems  Constitutional: Negative.   HENT: Negative.   Respiratory: Negative.  Negative for shortness of breath.   Cardiovascular: Negative.  Negative for palpitations.  Gastrointestinal: Negative.   Endocrine: Negative.   Genitourinary: Negative.   Musculoskeletal: Negative.   Neurological: Negative.   Hematological: Negative.   Psychiatric/Behavioral: Negative.   All other systems reviewed and are negative.      Objective:   Physical Exam  Constitutional: He is oriented to person, place, and time. He appears well-developed and well-nourished. No distress.  HENT:  Head: Normocephalic.  Right Ear: External ear normal.  Left Ear: External ear normal.  Nose: Nose normal.  Mouth/Throat: Oropharynx is clear and moist.  Eyes: Pupils are equal, round, and reactive to light. Right eye exhibits no discharge. Left eye exhibits no discharge.  Neck: Normal range of motion. Neck supple. No thyromegaly present.  Cardiovascular:  Normal rate, regular rhythm, normal heart sounds and intact distal pulses.   No murmur heard. Pulmonary/Chest: Effort normal and breath sounds normal. No respiratory distress. He has no wheezes.  Abdominal: Soft. Bowel sounds are normal. He exhibits no distension. There is no tenderness.  Musculoskeletal: Normal range of motion. He exhibits no edema or tenderness.  Neurological: He is alert and oriented to person, place, and time. He has normal reflexes. No cranial nerve deficit.  Skin: Skin is warm and dry. No rash noted. No erythema.  Psychiatric: He has a normal mood and affect. His behavior is normal. Judgment and thought content normal.  Vitals reviewed.  Chest X-ray- WNL Preliminary reading by Evelina Dun, FNP [facility]    BP 128/85 mmHg  Pulse 72  Temp(Src) 97.7 F (36.5 C) (Oral)  Ht 5\' 10"  (1.778 m)  Wt 222 lb 12.8 oz (101.061 kg)  BMI 31.97 kg/m2     Assessment & Plan:  1. Type 2 diabetes mellitus without complication, with long-term current use of insulin (HCC) - Dulaglutide (TRULICITY) [id number] [id number] SOPN; Inject 1 Syringe into the skin once a week.  Dispense: 2 mL; Refill: 3 - glimepiride (AMARYL) 4 MG tablet; TAKE ONE TABLET BY MOUTH ONCE DAILY BEFORE BREAKFAST  Dispense: 30 tablet; Refill: 3 - metFORMIN (GLUCOPHAGE) 1000 MG tablet; Take 1 tablet (1,000 mg total) by mouth 2 (two) times daily with a meal.  Dispense: 180 tablet; Refill: 3  2. Gastroesophageal reflux disease, esophagitis presence not specified - omeprazole (PRILOSEC) 20 MG capsule; Take 1 capsule (20 mg total) by mouth daily.  Dispense: 30  capsule; Refill: 3  3. Essential hypertension --Daily blood pressure log given with instructions on how to fill out and told to bring to next visit -Dash diet information given -Exercise encouraged - Stress Management  -Continue current meds -RTO in 3 months  -BMP  4. Obesity  5. SOB (shortness of breath) - DG Chest 2 View; Future - budesonide-formoterol  (SYMBICORT) 80-4.5 MCG/ACT inhaler; Inhale 2 puffs into the lungs 2 (two) times daily.  Dispense: 1 Inhaler; Refill: 3  6. Cough -PT started on Symbicort today - DG Chest 2 View; Future - budesonide-formoterol (SYMBICORT) 80-4.5 MCG/ACT inhaler; Inhale 2 puffs into the lungs 2 (two) times daily.  Dispense: 1 Inhaler; Refill: 3   Continue all meds Labs pending Health Maintenance reviewed Diet and exercise encouraged RTO 3 months  Evelina Dun, FNP

## 2015-09-05 ENCOUNTER — Other Ambulatory Visit: Payer: Self-pay | Admitting: Family

## 2015-09-05 DIAGNOSIS — I517 Cardiomegaly: Secondary | ICD-10-CM

## 2015-09-05 MED ORDER — LEVOFLOXACIN 500 MG PO TABS: 500.0000 mg | ORAL_TABLET | Freq: Every day | ORAL | Status: DC

## 2015-09-28 ENCOUNTER — Telehealth: Payer: Self-pay | Admitting: Family

## 2015-09-28 DIAGNOSIS — E119 Type 2 diabetes mellitus without complications: Secondary | ICD-10-CM

## 2015-09-28 DIAGNOSIS — Z794 Long term (current) use of insulin: Principal | ICD-10-CM

## 2015-09-28 MED ORDER — DULAGLUTIDE 0.75 MG/0.5ML ~~LOC~~ SOAJ: 1.0000 | SUBCUTANEOUS | Status: DC

## 2015-09-28 MED ORDER — GLIMEPIRIDE 4 MG PO TABS
ORAL_TABLET | ORAL | Status: DC
Start: 2015-09-28 — End: 2016-01-29

## 2015-09-28 NOTE — Telephone Encounter (Signed)
done

## 2015-10-05 ENCOUNTER — Ambulatory Visit (AMBULATORY_SURGERY_CENTER): Payer: Self-pay

## 2015-10-05 VITALS — Ht 70.0 in | Wt 223.6 lb

## 2015-10-05 DIAGNOSIS — Z1211 Encounter for screening for malignant neoplasm of colon: Secondary | ICD-10-CM

## 2015-10-05 NOTE — Progress Notes (Signed)
No allergies to eggs or soy No diet/weight loss meds No home oxgyen No past problems with anesthesia  Has email and internet; registered emmi

## 2015-10-19 ENCOUNTER — Encounter: Payer: Self-pay | Admitting: Internal Medicine

## 2015-10-19 ENCOUNTER — Ambulatory Visit (AMBULATORY_SURGERY_CENTER): Payer: BC Managed Care – PPO | Admitting: Internal Medicine

## 2015-10-19 VITALS — BP 117/78 | HR 69 | Temp 98.0°F | Resp 12 | Ht 70.0 in | Wt 223.0 lb

## 2015-10-19 DIAGNOSIS — D123 Benign neoplasm of transverse colon: Secondary | ICD-10-CM

## 2015-10-19 DIAGNOSIS — Z1211 Encounter for screening for malignant neoplasm of colon: Secondary | ICD-10-CM | POA: Diagnosis present

## 2015-10-19 DIAGNOSIS — D122 Benign neoplasm of ascending colon: Secondary | ICD-10-CM

## 2015-10-19 MED ORDER — SODIUM CHLORIDE 0.9 % IV SOLN: 500.0000 mL | INTRAVENOUS | Status: DC

## 2015-10-19 NOTE — Progress Notes (Signed)
To pacu vss patent aw report to rn 

## 2015-10-19 NOTE — Op Note (Signed)
[facility]  [address] [place], [id number]   COLONOSCOPY PROCEDURE REPORT  PATIENT: Adrian Tucker, Adrian Tucker  MR#: [id number] BIRTHDATE: [birth year] , 16  yrs. old GENDER: male ENDOSCOPIST: Gatha Mayer, MD, [facility] PROCEDURE DATE:  10/19/2015 PROCEDURE:   Colonoscopy, screening and Colonoscopy with snare polypectomy First Screening Colonoscopy - Avg.  risk and is 50 yrs.  old or older Yes.  Prior Negative Screening - Now for repeat screening. N/A  History of Adenoma - Now for follow-up colonoscopy & has been > or = to 3 yrs.  N/A  Polyps removed today? Yes ASA CLASS:   Class II INDICATIONS:Screening for colonic neoplasia and Colorectal Neoplasm Risk Assessment for this procedure is average risk. MEDICATIONS: Propofol 280 mg IV and Monitored anesthesia care  DESCRIPTION OF PROCEDURE:   After the risks benefits and alternatives of the procedure were thoroughly explained, informed consent was obtained.  The digital rectal exam revealed no abnormalities of the rectum, revealed no prostatic nodules, and revealed the prostate was not enlarged.   The [id number] endoscope was introduced through the anus and advanced to the cecum, which was identified by both the appendix and ileocecal valve. No adverse events experienced.   The quality of the prep was good.  (MiraLax was used)  The instrument was then slowly withdrawn as the colon was fully examined. Estimated blood loss is zero unless otherwise noted in this procedure report.      COLON FINDINGS: Two polypoid shaped sessile polyps ranging from 6 to 8mm in size were found in the transverse colon and ascending colon. Polypectomies were performed with a cold snare.  The resection was complete, the polyp tissue was completely retrieved and sent to histology.  Retroflexed views revealed no abnormalities. The time to cecum = 2.5 Withdrawal time = 11.1   The scope was withdrawn and the procedure  completed. COMPLICATIONS: There were no immediate complications.  ENDOSCOPIC IMPRESSION: Two sessile polyps ranging from 6 to 23mm in size were found in the transverse colon and ascending colon; polypectomies were performed with a cold snare  RECOMMENDATIONS: Timing of repeat colonoscopy will be determined by pathology findings.  eSigned:  Gatha Mayer, MD, [facility] 10/19/2015 11:23 AM   cc: Evelina Dun, NP and The Patient

## 2015-10-19 NOTE — Progress Notes (Signed)
Called to room to assist during endoscopic procedure.  Patient ID and intended procedure confirmed with present staff. Received instructions for my participation in the procedure from the performing physician.  

## 2015-10-19 NOTE — Patient Instructions (Addendum)
I found and removed 2 polyps that look benign.  I will let you know pathology results and when to have another routine colonoscopy by mail.  I appreciate the opportunity to care for you. Gatha Mayer, MD, [facility]   Discharge instructions given. Handout on polyps. Resume previous medications. YOU HAD AN ENDOSCOPIC PROCEDURE TODAY AT [facility]:   Refer to the procedure report that was given to you for any specific questions about what was found during the examination.  If the procedure report does not answer your questions, please call your gastroenterologist to clarify.  If you requested that your care partner not be given the details of your procedure findings, then the procedure report has been included in a sealed envelope for you to review at your convenience later.  YOU SHOULD EXPECT: Some feelings of bloating in the abdomen. Passage of more gas than usual.  Walking can help get rid of the air that was put into your GI tract during the procedure and reduce the bloating. If you had a lower endoscopy (such as a colonoscopy or flexible sigmoidoscopy) you may notice spotting of blood in your stool or on the toilet paper. If you underwent a bowel prep for your procedure, you may not have a normal bowel movement for a few days.  Please Note:  You might notice some irritation and congestion in your nose or some drainage.  This is from the oxygen used during your procedure.  There is no need for concern and it should clear up in a day or so.  SYMPTOMS TO REPORT IMMEDIATELY:   Following lower endoscopy (colonoscopy or flexible sigmoidoscopy):  Excessive amounts of blood in the stool  Significant tenderness or worsening of abdominal pains  Swelling of the abdomen that is new, acute  Fever of 100F or higher   For urgent or emergent issues, a gastroenterologist can be reached at any hour by calling [phone].   DIET: Your first meal following the procedure  should be a small meal and then it is ok to progress to your normal diet. Heavy or fried foods are harder to digest and may make you feel nauseous or bloated.  Likewise, meals heavy in dairy and vegetables can increase bloating.  Drink plenty of fluids but you should avoid alcoholic beverages for 24 hours.  ACTIVITY:  You should plan to take it easy for the rest of today and you should NOT DRIVE or use heavy machinery until tomorrow (because of the sedation medicines used during the test).    FOLLOW UP: Our staff will call the number listed on your records the next business day following your procedure to check on you and address any questions or concerns that you may have regarding the information given to you following your procedure. If we do not reach you, we will leave a message.  However, if you are feeling well and you are not experiencing any problems, there is no need to return our call.  We will assume that you have returned to your regular daily activities without incident.  If any biopsies were taken you will be contacted by phone or by letter within the next 1-3 weeks.  Please call us at [phone] if you have not heard about the biopsies in 3 weeks.    SIGNATURES/CONFIDENTIALITY: You and/or your care partner have signed paperwork which will be entered into your electronic medical record.  These signatures attest to the fact that that the information above  on your After Visit Summary has been reviewed and is understood.  Full responsibility of the confidentiality of this discharge information lies with you and/or your care-partner.

## 2015-10-22 ENCOUNTER — Telehealth: Payer: Self-pay | Admitting: *Deleted

## 2015-10-22 NOTE — Telephone Encounter (Signed)
Left message on f/u call 

## 2015-10-24 ENCOUNTER — Encounter: Payer: Self-pay | Admitting: Internal Medicine

## 2015-10-24 DIAGNOSIS — Z860101 Personal history of adenomatous and serrated colon polyps: Secondary | ICD-10-CM | POA: Insufficient documentation

## 2015-10-24 DIAGNOSIS — Z86010 Personal history of colonic polyps: Secondary | ICD-10-CM

## 2015-10-24 HISTORY — DX: Personal history of colonic polyps: Z86.010

## 2015-10-24 HISTORY — DX: Personal history of adenomatous and serrated colon polyps: Z86.0101

## 2015-10-24 NOTE — Progress Notes (Signed)
Quick Note:  2 adenomas < 1 cm Colon recall 2022 ______

## 2015-11-23 ENCOUNTER — Other Ambulatory Visit: Payer: Self-pay | Admitting: Family

## 2015-12-06 ENCOUNTER — Ambulatory Visit: Payer: BC Managed Care – PPO | Admitting: Family

## 2015-12-07 ENCOUNTER — Encounter: Payer: Self-pay | Admitting: Family

## 2016-01-29 ENCOUNTER — Other Ambulatory Visit: Payer: Self-pay | Admitting: Family

## 2016-02-16 ENCOUNTER — Other Ambulatory Visit: Payer: Self-pay | Admitting: Family

## 2016-02-18 NOTE — Telephone Encounter (Signed)
Patient last seen in Dec 2016 and was told to return in 3 months. Please advise on refill

## 2016-02-26 ENCOUNTER — Telehealth: Payer: Self-pay | Admitting: Family

## 2016-02-26 DIAGNOSIS — I10 Essential (primary) hypertension: Secondary | ICD-10-CM

## 2016-02-26 MED ORDER — VALSARTAN-HYDROCHLOROTHIAZIDE 160-25 MG PO TABS: 1.0000 | ORAL_TABLET | Freq: Every day | ORAL | Status: DC

## 2016-02-26 NOTE — Telephone Encounter (Signed)
done

## 2016-04-06 ENCOUNTER — Other Ambulatory Visit: Payer: Self-pay | Admitting: Family

## 2016-04-09 ENCOUNTER — Ambulatory Visit (INDEPENDENT_AMBULATORY_CARE_PROVIDER_SITE_OTHER): Payer: BC Managed Care – PPO | Admitting: Family

## 2016-04-09 ENCOUNTER — Encounter: Payer: Self-pay | Admitting: Family

## 2016-04-09 VITALS — BP 125/85 | HR 73 | Temp 97.1°F | Ht 70.0 in | Wt 225.0 lb

## 2016-04-09 DIAGNOSIS — K219 Gastro-esophageal reflux disease without esophagitis: Secondary | ICD-10-CM

## 2016-04-09 DIAGNOSIS — I10 Essential (primary) hypertension: Secondary | ICD-10-CM

## 2016-04-09 DIAGNOSIS — E119 Type 2 diabetes mellitus without complications: Secondary | ICD-10-CM

## 2016-04-09 DIAGNOSIS — Z96642 Presence of left artificial hip joint: Secondary | ICD-10-CM

## 2016-04-09 LAB — BAYER DCA HB A1C WAIVED: HB A1C (BAYER DCA - WAIVED): 8.9 % — ABNORMAL HIGH (ref <7.0)

## 2016-04-09 MED ORDER — OMEPRAZOLE 20 MG PO CPDR: 20.0000 mg | DELAYED_RELEASE_CAPSULE | Freq: Every day | ORAL | 3 refills | Status: DC

## 2016-04-09 MED ORDER — GLIMEPIRIDE 4 MG PO TABS: ORAL_TABLET | ORAL | 1 refills | Status: DC

## 2016-04-09 MED ORDER — VALSARTAN-HYDROCHLOROTHIAZIDE 160-25 MG PO TABS: 1.0000 | ORAL_TABLET | Freq: Every day | ORAL | 1 refills | Status: DC

## 2016-04-09 NOTE — Progress Notes (Signed)
Subjective:    Patient ID: Adrian Tucker, male    DOB: Mar 09, 1961, 55 y.o.   MRN: [id number]  PT presents to the office today for chronic follow up. 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 PT states this is doing well and has no pain at this time.  Diabetes  He presents for his follow-up diabetic visit. He has type 2 diabetes mellitus. His disease course has been fluctuating. There are no hypoglycemic associated symptoms. Pertinent negatives for hypoglycemia include no confusion or headaches. Pertinent negatives for diabetes include no blurred vision, no foot paresthesias, no foot ulcerations and no visual change. Pertinent negatives for hypoglycemia complications include no blackouts and no hospitalization. Symptoms are improving. Pertinent negatives for diabetic complications include no CVA, heart disease, nephropathy or peripheral neuropathy. Risk factors for coronary artery disease include diabetes mellitus, hypertension, male sex, obesity and stress. Current diabetic treatment includes oral agent (triple therapy). He is compliant with treatment all of the time. He is following a generally healthy diet. His breakfast blood glucose range is generally 110-130 mg/dl. An ACE inhibitor/angiotensin II receptor blocker is being taken. Eye exam is current (Aug 2016).  Hypertension  This is a chronic problem. The current episode started more than 1 year ago. The problem has been resolved since onset. The problem is controlled. Pertinent negatives include no blurred vision, headaches, palpitations, peripheral edema or shortness of breath. Risk factors for coronary artery disease include diabetes mellitus, male gender and obesity. Past treatments include angiotensin blockers. There is no history of kidney disease, CAD/MI, CVA, heart failure or a thyroid problem. There is no history of sleep apnea.  Gastroesophageal Reflux  He reports no belching, no coughing or no heartburn. This is a chronic problem. The  current episode started more than 1 year ago. The problem occurs occasionally. The problem has been waxing and waning. The symptoms are aggravated by lying down and certain foods. He has tried a PPI for the symptoms. The treatment provided significant relief.      Review of Systems  Constitutional: Negative.   HENT: Negative.   Eyes: Negative for blurred vision.  Respiratory: Negative.  Negative for cough and shortness of breath.   Cardiovascular: Negative.  Negative for palpitations.  Gastrointestinal: Negative.  Negative for heartburn.  Endocrine: Negative.   Genitourinary: Negative.   Musculoskeletal: Negative.   Neurological: Negative.  Negative for headaches.  Hematological: Negative.   Psychiatric/Behavioral: Negative.  Negative for confusion.  All other systems reviewed and are negative.      Objective:   Physical Exam  Constitutional: He is oriented to person, place, and time. He appears well-developed and well-nourished. No distress.  HENT:  Head: Normocephalic.  Right Ear: External ear normal.  Left Ear: External ear normal.  Nose: Nose normal.  Mouth/Throat: Oropharynx is clear and moist.  Eyes: Pupils are equal, round, and reactive to light. Right eye exhibits no discharge. Left eye exhibits no discharge.  Neck: Normal range of motion. Neck supple. No thyromegaly present.  Cardiovascular: Normal rate, regular rhythm, normal heart sounds and intact distal pulses.   No murmur heard. Pulmonary/Chest: Effort normal and breath sounds normal. No respiratory distress. He has no wheezes.  Abdominal: Soft. Bowel sounds are normal. He exhibits no distension. There is no tenderness.  Musculoskeletal: Normal range of motion. He exhibits no edema or tenderness.  Neurological: He is alert and oriented to person, place, and time. He has normal reflexes. No cranial nerve deficit.  Skin: Skin is warm  and dry. No rash noted. No erythema.  Psychiatric: He has a normal mood and  affect. His behavior is normal. Judgment and thought content normal.  Vitals reviewed.   BP 125/85   Pulse 73   Temp 97.1 F (36.2 C) (Oral)   Ht _0  (1.778 m)   Wt 225 lb (102.1 kg)   BMI 32.28 kg/m        Assessment & Plan:  1. Essential hypertension - valsartan-hydrochlorothiazide (DIOVAN HCT) 160-25 MG tablet; Take 1 tablet by mouth daily.  Dispense: 90 tablet; Refill: 1 - CMP14+EGFR  2. Type 2 diabetes mellitus without complication, without long-term current use of insulin (HCC) - glimepiride (AMARYL) 4 MG tablet; TAKE 1 TABLET BEFORE BREAKFAST  Dispense: 90 tablet; Refill: 1 - Bayer DCA Hb A1c Waived - CMP14+EGFR - Microalbumin / creatinine urine ratio  3. Morbid obesity, unspecified obesity type ([place]) - CMP14+EGFR  4. Gastroesophageal reflux disease, esophagitis presence not specified - omeprazole (PRILOSEC) 20 MG capsule; Take 1 capsule (20 mg total) by mouth daily.  Dispense: 90 capsule; Refill: 3 - CMP14+EGFR  5. History of left hip replacement    Continue all meds Labs pending Health Maintenance reviewed Diet and exercise encouraged RTO 6 months  Evelina Dun, FNP

## 2016-04-09 NOTE — Patient Instructions (Signed)

## 2016-04-10 ENCOUNTER — Other Ambulatory Visit: Payer: Self-pay | Admitting: Family

## 2016-04-10 LAB — CMP14+EGFR
A/G RATIO: 1.3 (ref 1.2–2.2)
ALT: 77 IU/L — ABNORMAL HIGH (ref 0–44)
AST: 43 IU/L — AB (ref 0–40)
Albumin: 4.2 g/dL (ref 3.5–5.5)
Alkaline Phosphatase: 70 IU/L (ref 39–117)
BILIRUBIN TOTAL: 0.4 mg/dL (ref 0.0–1.2)
BUN/Creatinine Ratio: 21 — ABNORMAL HIGH (ref 9–20)
BUN: 27 mg/dL — ABNORMAL HIGH (ref 6–24)
CO2: 20 mmol/L (ref 18–29)
CREATININE: 1.26 mg/dL (ref 0.76–1.27)
Calcium: 9.3 mg/dL (ref 8.7–10.2)
Chloride: 101 mmol/L (ref 96–106)
GFR calc Af Amer: 74 mL/min/{1.73_m2} (ref >59)
GFR, EST NON AFRICAN AMERICAN: 64 mL/min/{1.73_m2} (ref >59)
GLUCOSE: 226 mg/dL — AB (ref 65–99)
Globulin, Total: 3.2 g/dL (ref 1.5–4.5)
Potassium: 4.7 mmol/L (ref 3.5–5.2)
SODIUM: 138 mmol/L (ref 134–144)
Total Protein: 7.4 g/dL (ref 6.0–8.5)

## 2016-04-10 LAB — MICROALBUMIN / CREATININE URINE RATIO
CREATININE, UR: 246.5 mg/dL
MICROALB/CREAT RATIO: 7 mg/g creat (ref 0.0–30.0)
MICROALBUM., U, RANDOM: 17.2 ug/mL

## 2016-04-10 MED ORDER — DULAGLUTIDE 1.5 MG/0.5ML ~~LOC~~ SOAJ: 1.5000 mg | SUBCUTANEOUS | 3 refills | Status: DC

## 2016-06-25 ENCOUNTER — Other Ambulatory Visit: Payer: Self-pay

## 2016-06-25 DIAGNOSIS — E119 Type 2 diabetes mellitus without complications: Secondary | ICD-10-CM

## 2016-06-25 MED ORDER — GLUCOSE BLOOD VI STRP: ORAL_STRIP | 2 refills | Status: DC

## 2016-06-25 MED ORDER — GLUCOSE BLOOD VI STRP: ORAL_STRIP | 12 refills | Status: DC

## 2016-07-27 ENCOUNTER — Other Ambulatory Visit: Payer: Self-pay | Admitting: Family

## 2016-07-27 DIAGNOSIS — Z794 Long term (current) use of insulin: Principal | ICD-10-CM

## 2016-07-27 DIAGNOSIS — E119 Type 2 diabetes mellitus without complications: Secondary | ICD-10-CM

## 2016-10-10 ENCOUNTER — Ambulatory Visit (INDEPENDENT_AMBULATORY_CARE_PROVIDER_SITE_OTHER): Payer: BC Managed Care – PPO | Admitting: Family

## 2016-10-10 ENCOUNTER — Encounter: Payer: Self-pay | Admitting: Family

## 2016-10-10 VITALS — BP 126/83 | HR 72 | Temp 97.1°F | Ht 70.0 in | Wt 226.0 lb

## 2016-10-10 DIAGNOSIS — E119 Type 2 diabetes mellitus without complications: Secondary | ICD-10-CM

## 2016-10-10 DIAGNOSIS — K219 Gastro-esophageal reflux disease without esophagitis: Secondary | ICD-10-CM

## 2016-10-10 DIAGNOSIS — I10 Essential (primary) hypertension: Secondary | ICD-10-CM

## 2016-10-10 LAB — BAYER DCA HB A1C WAIVED: HB A1C (BAYER DCA - WAIVED): 7.3 % — ABNORMAL HIGH (ref <7.0)

## 2016-10-10 NOTE — Progress Notes (Signed)
Subjective:    Patient ID: Adrian Tucker, male    DOB: 07-Sep-1961, 56 y.o.   MRN: [id number]  PT presents to the office today for chronic follow up. 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  Diabetes  He presents for his follow-up diabetic visit. He has type 2 diabetes mellitus. His disease course has been fluctuating. There are no hypoglycemic associated symptoms. Pertinent negatives for hypoglycemia include no confusion or headaches. Pertinent negatives for diabetes include no blurred vision, no foot paresthesias, no foot ulcerations and no visual change. Pertinent negatives for hypoglycemia complications include no blackouts and no hospitalization. Symptoms are improving. Pertinent negatives for diabetic complications include no CVA, heart disease, nephropathy or peripheral neuropathy. Risk factors for coronary artery disease include diabetes mellitus, hypertension, male sex, obesity and stress. Current diabetic treatment includes oral agent (triple therapy). He is compliant with treatment all of the time. He is following a generally healthy diet. His breakfast blood glucose range is generally 140-180 mg/dl. An ACE inhibitor/angiotensin II receptor blocker is being taken. Eye exam is not current (Aug 2016).  Hypertension  This is a chronic problem. The current episode started more than 1 year ago. The problem has been resolved since onset. The problem is controlled. Pertinent negatives include no blurred vision, headaches, palpitations, peripheral edema or shortness of breath. Risk factors for coronary artery disease include diabetes mellitus, male gender and obesity. Past treatments include angiotensin blockers. There is no history of kidney disease, CAD/MI, CVA or heart failure. There is no history of sleep apnea or a thyroid problem.  Gastroesophageal Reflux  He reports no belching, no coughing or no heartburn. This is a chronic problem. The current episode started more than 1 year ago. The  problem occurs occasionally. The problem has been waxing and waning. The symptoms are aggravated by lying down and certain foods. He has tried a PPI for the symptoms. The treatment provided significant relief.      Review of Systems  Constitutional: Negative.   HENT: Negative.   Eyes: Negative for blurred vision.  Respiratory: Negative.  Negative for cough and shortness of breath.   Cardiovascular: Negative.  Negative for palpitations.  Gastrointestinal: Negative.  Negative for heartburn.  Endocrine: Negative.   Genitourinary: Negative.   Musculoskeletal: Negative.   Neurological: Negative.  Negative for headaches.  Hematological: Negative.   Psychiatric/Behavioral: Negative.  Negative for confusion.  All other systems reviewed and are negative.      Objective:   Physical Exam  Constitutional: He is oriented to person, place, and time. He appears well-developed and well-nourished. No distress.  HENT:  Head: Normocephalic.  Right Ear: External ear normal.  Left Ear: External ear normal.  Nose: Nose normal.  Mouth/Throat: Oropharynx is clear and moist.  Eyes: Pupils are equal, round, and reactive to light. Right eye exhibits no discharge. Left eye exhibits no discharge.  Neck: Normal range of motion. Neck supple. No thyromegaly present.  Cardiovascular: Normal rate, regular rhythm, normal heart sounds and intact distal pulses.   No murmur heard. Pulmonary/Chest: Effort normal and breath sounds normal. No respiratory distress. He has no wheezes.  Abdominal: Soft. Bowel sounds are normal. He exhibits no distension. There is no tenderness.  Musculoskeletal: Normal range of motion. He exhibits no edema or tenderness.  Neurological: He is alert and oriented to person, place, and time. He has normal reflexes. No cranial nerve deficit.  Skin: Skin is warm and dry. No rash noted. No erythema.  Psychiatric: He has  a normal mood and affect. His behavior is normal. Judgment and thought  content normal.  Vitals reviewed.   BP 126/83   Pulse 72   Temp 97.1 F (36.2 C) (Oral)   Ht '5\' 10"'$  (1.778 m)   Wt 226 lb (102.5 kg)   BMI 32.43 kg/m   Diabetic Foot Exam - Simple   Simple Foot Form Diabetic Foot exam was performed with the following findings:  Yes 10/10/2016  2:27 PM  Visual Inspection No deformities, no ulcerations, no other skin breakdown bilaterally:  Yes Sensation Testing Intact to touch and monofilament testing bilaterally:  Yes Pulse Check Posterior Tibialis and Dorsalis pulse intact bilaterally:  Yes Comments         Assessment & Plan:  1. Essential hypertension - CMP14+EGFR - Lipid panel  2. Gastroesophageal reflux disease, esophagitis presence not specified - CMP14+EGFR  3. Type 2 diabetes mellitus without complication, without long-term current use of insulin (HCC) - CMP14+EGFR - Lipid panel - Bayer DCA Hb A1c Waived - Ambulatory referral to Ophthalmology  4. Morbid obesity ([place]) - CMP14+EGFR   Continue all meds Labs pending Health Maintenance reviewed- Refuses vaccines today Diet and exercise encouraged RTO 6 months  Evelina Dun, FNP

## 2016-10-10 NOTE — Patient Instructions (Signed)
Diabetes Mellitus and Food It is important for you to manage your blood sugar (glucose) level. Your blood glucose level can be greatly affected by what you eat. Eating healthier foods in the appropriate amounts throughout the day at about the same time each day will help you control your blood glucose level. It can also help slow or prevent worsening of your diabetes mellitus. Healthy eating may even help you improve the level of your blood pressure and reach or maintain a healthy weight. General recommendations for healthful eating and cooking habits include:  Eating meals and snacks regularly. Avoid going long periods of time without eating to lose weight.  Eating a diet that consists mainly of plant-based foods, such as fruits, vegetables, nuts, legumes, and whole grains.  Using low-heat cooking methods, such as baking, instead of high-heat cooking methods, such as deep frying.  Work with your dietitian to make sure you understand how to use the Nutrition Facts information on food labels. How can food affect me? Carbohydrates Carbohydrates affect your blood glucose level more than any other type of food. Your dietitian will help you determine how many carbohydrates to eat at each meal and teach you how to count carbohydrates. Counting carbohydrates is important to keep your blood glucose at a healthy level, especially if you are using insulin or taking certain medicines for diabetes mellitus. Alcohol Alcohol can cause sudden decreases in blood glucose (hypoglycemia), especially if you use insulin or take certain medicines for diabetes mellitus. Hypoglycemia can be a life-threatening condition. Symptoms of hypoglycemia (sleepiness, dizziness, and disorientation) are similar to symptoms of having too much alcohol. If your health care provider has given you approval to drink alcohol, do so in moderation and use the following guidelines:  Women should not have more than one drink per day, and men  should not have more than two drinks per day. One drink is equal to: ? 12 oz of beer. ? 5 oz of wine. ? 1 oz of hard liquor.  Do not drink on an empty stomach.  Keep yourself hydrated. Have water, diet soda, or unsweetened iced tea.  Regular soda, juice, and other mixers might contain a lot of carbohydrates and should be counted.  What foods are not recommended? As you make food choices, it is important to remember that all foods are not the same. Some foods have fewer nutrients per serving than other foods, even though they might have the same number of calories or carbohydrates. It is difficult to get your body what it needs when you eat foods with fewer nutrients. Examples of foods that you should avoid that are high in calories and carbohydrates but low in nutrients include:  Trans fats (most processed foods list trans fats on the Nutrition Facts label).  Regular soda.  Juice.  Candy.  Sweets, such as cake, pie, doughnuts, and cookies.  Fried foods.  What foods can I eat? Eat nutrient-rich foods, which will nourish your body and keep you healthy. The food you should eat also will depend on several factors, including:  The calories you need.  The medicines you take.  Your weight.  Your blood glucose level.  Your blood pressure level.  Your cholesterol level.  You should eat a variety of foods, including:  Protein. ? Lean cuts of meat. ? Proteins low in saturated fats, such as fish, egg whites, and beans. Avoid processed meats.  Fruits and vegetables. ? Fruits and vegetables that may help control blood glucose levels, such as apples,   mangoes, and yams.  Dairy products. ? Choose fat-free or low-fat dairy products, such as milk, yogurt, and cheese.  Grains, bread, pasta, and rice. ? Choose whole grain products, such as multigrain bread, whole oats, and brown rice. These foods may help control blood pressure.  Fats. ? Foods containing healthful fats, such as  nuts, avocado, olive oil, canola oil, and fish.  Does everyone with diabetes mellitus have the same meal plan? Because every person with diabetes mellitus is different, there is not one meal plan that works for everyone. It is very important that you meet with a dietitian who will help you create a meal plan that is just right for you. This information is not intended to replace advice given to you by your health care provider. Make sure you discuss any questions you have with your health care provider. Document Released: 05/22/2005 Document Revised: 01/31/2016 Document Reviewed: 07/22/2013 Elsevier Interactive Patient Education  2017 [employer]  

## 2016-10-11 LAB — CMP14+EGFR
ALBUMIN: 4.4 g/dL (ref 3.5–5.5)
ALT: 44 IU/L (ref 0–44)
AST: 37 IU/L (ref 0–40)
Albumin/Globulin Ratio: 1.4 (ref 1.2–2.2)
Alkaline Phosphatase: 63 IU/L (ref 39–117)
BUN / CREAT RATIO: 26 — AB (ref 9–20)
BUN: 30 mg/dL — AB (ref 6–24)
Bilirubin Total: 0.6 mg/dL (ref 0.0–1.2)
CO2: 23 mmol/L (ref 18–29)
CREATININE: 1.17 mg/dL (ref 0.76–1.27)
Calcium: 9.5 mg/dL (ref 8.7–10.2)
Chloride: 102 mmol/L (ref 96–106)
GFR calc non Af Amer: 70 mL/min/{1.73_m2} (ref >59)
GFR, EST AFRICAN AMERICAN: 81 mL/min/{1.73_m2} (ref >59)
GLUCOSE: 106 mg/dL — AB (ref 65–99)
Globulin, Total: 3.1 g/dL (ref 1.5–4.5)
Potassium: 5.1 mmol/L (ref 3.5–5.2)
Sodium: 141 mmol/L (ref 134–144)
TOTAL PROTEIN: 7.5 g/dL (ref 6.0–8.5)

## 2016-10-11 LAB — LIPID PANEL
CHOLESTEROL TOTAL: 138 mg/dL (ref 100–199)
Chol/HDL Ratio: 3.7 ratio units (ref 0.0–5.0)
HDL: 37 mg/dL — AB (ref >39)
LDL CALC: 79 mg/dL (ref 0–99)
Triglycerides: 111 mg/dL (ref 0–149)
VLDL CHOLESTEROL CAL: 22 mg/dL (ref 5–40)

## 2016-10-15 ENCOUNTER — Other Ambulatory Visit: Payer: Self-pay | Admitting: Family

## 2016-10-15 DIAGNOSIS — E119 Type 2 diabetes mellitus without complications: Secondary | ICD-10-CM

## 2016-10-29 ENCOUNTER — Other Ambulatory Visit: Payer: Self-pay | Admitting: Family

## 2016-10-29 DIAGNOSIS — E119 Type 2 diabetes mellitus without complications: Secondary | ICD-10-CM

## 2016-10-29 DIAGNOSIS — Z794 Long term (current) use of insulin: Principal | ICD-10-CM

## 2016-11-29 ENCOUNTER — Other Ambulatory Visit: Payer: Self-pay | Admitting: Family

## 2016-11-29 DIAGNOSIS — I10 Essential (primary) hypertension: Secondary | ICD-10-CM

## 2017-02-25 LAB — HM DIABETES EYE EXAM

## 2017-04-09 ENCOUNTER — Ambulatory Visit: Payer: BC Managed Care – PPO | Admitting: Family

## 2017-04-16 ENCOUNTER — Encounter: Payer: Self-pay | Admitting: Family

## 2017-04-16 ENCOUNTER — Ambulatory Visit (INDEPENDENT_AMBULATORY_CARE_PROVIDER_SITE_OTHER): Payer: BC Managed Care – PPO | Admitting: Family

## 2017-04-16 VITALS — BP 129/84 | HR 68 | Temp 96.8°F | Ht 70.0 in | Wt 223.8 lb

## 2017-04-16 DIAGNOSIS — K219 Gastro-esophageal reflux disease without esophagitis: Secondary | ICD-10-CM

## 2017-04-16 DIAGNOSIS — I10 Essential (primary) hypertension: Secondary | ICD-10-CM

## 2017-04-16 DIAGNOSIS — E119 Type 2 diabetes mellitus without complications: Secondary | ICD-10-CM | POA: Diagnosis not present

## 2017-04-16 LAB — CMP14+EGFR
ALBUMIN: 4.7 g/dL (ref 3.5–5.5)
ALT: 53 IU/L — ABNORMAL HIGH (ref 0–44)
AST: 37 IU/L (ref 0–40)
Albumin/Globulin Ratio: 1.6 (ref 1.2–2.2)
Alkaline Phosphatase: 67 IU/L (ref 39–117)
BILIRUBIN TOTAL: 0.5 mg/dL (ref 0.0–1.2)
BUN / CREAT RATIO: 22 — AB (ref 9–20)
BUN: 32 mg/dL — AB (ref 6–24)
CO2: 21 mmol/L (ref 20–29)
CREATININE: 1.44 mg/dL — AB (ref 0.76–1.27)
Calcium: 9.2 mg/dL (ref 8.7–10.2)
Chloride: 104 mmol/L (ref 96–106)
GFR calc non Af Amer: 54 mL/min/{1.73_m2} — ABNORMAL LOW (ref >59)
GFR, EST AFRICAN AMERICAN: 63 mL/min/{1.73_m2} (ref >59)
GLUCOSE: 165 mg/dL — AB (ref 65–99)
Globulin, Total: 2.9 g/dL (ref 1.5–4.5)
Potassium: 5.3 mmol/L — ABNORMAL HIGH (ref 3.5–5.2)
Sodium: 137 mmol/L (ref 134–144)
TOTAL PROTEIN: 7.6 g/dL (ref 6.0–8.5)

## 2017-04-16 LAB — BAYER DCA HB A1C WAIVED: HB A1C (BAYER DCA - WAIVED): 7.3 % — ABNORMAL HIGH (ref <7.0)

## 2017-04-16 LAB — LIPID PANEL
Chol/HDL Ratio: 4.1 ratio (ref 0.0–5.0)
Cholesterol, Total: 153 mg/dL (ref 100–199)
HDL: 37 mg/dL — ABNORMAL LOW (ref >39)
LDL CALC: 81 mg/dL (ref 0–99)
Triglycerides: 177 mg/dL — ABNORMAL HIGH (ref 0–149)
VLDL CHOLESTEROL CAL: 35 mg/dL (ref 5–40)

## 2017-04-16 NOTE — Patient Instructions (Signed)
Diabetes Mellitus and Food It is important for you to manage your blood sugar (glucose) level. Your blood glucose level can be greatly affected by what you eat. Eating healthier foods in the appropriate amounts throughout the day at about the same time each day will help you control your blood glucose level. It can also help slow or prevent worsening of your diabetes mellitus. Healthy eating may even help you improve the level of your blood pressure and reach or maintain a healthy weight. General recommendations for healthful eating and cooking habits include:  Eating meals and snacks regularly. Avoid going long periods of time without eating to lose weight.  Eating a diet that consists mainly of plant-based foods, such as fruits, vegetables, nuts, legumes, and whole grains.  Using low-heat cooking methods, such as baking, instead of high-heat cooking methods, such as deep frying.  Work with your dietitian to make sure you understand how to use the Nutrition Facts information on food labels. How can food affect me? Carbohydrates Carbohydrates affect your blood glucose level more than any other type of food. Your dietitian will help you determine how many carbohydrates to eat at each meal and teach you how to count carbohydrates. Counting carbohydrates is important to keep your blood glucose at a healthy level, especially if you are using insulin or taking certain medicines for diabetes mellitus. Alcohol Alcohol can cause sudden decreases in blood glucose (hypoglycemia), especially if you use insulin or take certain medicines for diabetes mellitus. Hypoglycemia can be a life-threatening condition. Symptoms of hypoglycemia (sleepiness, dizziness, and disorientation) are similar to symptoms of having too much alcohol. If your health care provider has given you approval to drink alcohol, do so in moderation and use the following guidelines:  Women should not have more than one drink per day, and men  should not have more than two drinks per day. One drink is equal to: ? 12 oz of beer. ? 5 oz of wine. ? 1 oz of hard liquor.  Do not drink on an empty stomach.  Keep yourself hydrated. Have water, diet soda, or unsweetened iced tea.  Regular soda, juice, and other mixers might contain a lot of carbohydrates and should be counted.  What foods are not recommended? As you make food choices, it is important to remember that all foods are not the same. Some foods have fewer nutrients per serving than other foods, even though they might have the same number of calories or carbohydrates. It is difficult to get your body what it needs when you eat foods with fewer nutrients. Examples of foods that you should avoid that are high in calories and carbohydrates but low in nutrients include:  Trans fats (most processed foods list trans fats on the Nutrition Facts label).  Regular soda.  Juice.  Candy.  Sweets, such as cake, pie, doughnuts, and cookies.  Fried foods.  What foods can I eat? Eat nutrient-rich foods, which will nourish your body and keep you healthy. The food you should eat also will depend on several factors, including:  The calories you need.  The medicines you take.  Your weight.  Your blood glucose level.  Your blood pressure level.  Your cholesterol level.  You should eat a variety of foods, including:  Protein. ? Lean cuts of meat. ? Proteins low in saturated fats, such as fish, egg whites, and beans. Avoid processed meats.  Fruits and vegetables. ? Fruits and vegetables that may help control blood glucose levels, such as apples,   mangoes, and yams.  Dairy products. ? Choose fat-free or low-fat dairy products, such as milk, yogurt, and cheese.  Grains, bread, pasta, and rice. ? Choose whole grain products, such as multigrain bread, whole oats, and brown rice. These foods may help control blood pressure.  Fats. ? Foods containing healthful fats, such as  nuts, avocado, olive oil, canola oil, and fish.  Does everyone with diabetes mellitus have the same meal plan? Because every person with diabetes mellitus is different, there is not one meal plan that works for everyone. It is very important that you meet with a dietitian who will help you create a meal plan that is just right for you. This information is not intended to replace advice given to you by your health care provider. Make sure you discuss any questions you have with your health care provider. Document Released: 05/22/2005 Document Revised: 01/31/2016 Document Reviewed: 07/22/2013 Elsevier Interactive Patient Education  2017 [employer]  

## 2017-04-16 NOTE — Progress Notes (Signed)
Subjective:    Patient ID: Adrian Tucker, male    DOB: April 21, 1961, 56 y.o.   MRN: [id number]  PT presents to the office today for chronic follow up. 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 Diabetes  He presents for his follow-up diabetic visit. He has type 2 diabetes mellitus. His disease course has been stable. There are no hypoglycemic associated symptoms. There are no diabetic associated symptoms. Pertinent negatives for diabetes include no blurred vision, no foot paresthesias and no visual change. There are no hypoglycemic complications. Symptoms are stable. There are no diabetic complications. Pertinent negatives for diabetic complications include no CVA or heart disease. Risk factors for coronary artery disease include diabetes mellitus, dyslipidemia, male sex, obesity and sedentary lifestyle. He is following a generally healthy diet. (Has not take BS recently)  Hypertension  This is a chronic problem. The current episode started more than 1 year ago. The problem has been resolved since onset. The problem is controlled. Pertinent negatives include no blurred vision, malaise/fatigue, peripheral edema or shortness of breath. Risk factors for coronary artery disease include dyslipidemia, obesity and sedentary lifestyle. There is no history of kidney disease, CAD/MI or CVA.  Gastroesophageal Reflux  He reports no belching, no coughing or no heartburn. This is a chronic problem. The problem occurs rarely. The problem has been resolved. The symptoms are aggravated by certain foods. Risk factors include obesity. He has tried a PPI for the symptoms. The treatment provided moderate relief.      Review of Systems  Constitutional: Negative for malaise/fatigue.  Eyes: Negative for blurred vision.  Respiratory: Negative for cough and shortness of breath.   Gastrointestinal: Negative for heartburn.  All other systems reviewed and are negative.      Objective:   Physical Exam    Constitutional: He is oriented to person, place, and time. He appears well-developed and well-nourished. No distress.  HENT:  Head: Normocephalic.  Right Ear: External ear normal.  Left Ear: External ear normal.  Nose: Nose normal.  Mouth/Throat: Oropharynx is clear and moist.  Eyes: Pupils are equal, round, and reactive to light. Right eye exhibits no discharge. Left eye exhibits no discharge.  Neck: Normal range of motion. Neck supple. No thyromegaly present.  Cardiovascular: Normal rate, regular rhythm, normal heart sounds and intact distal pulses.   No murmur heard. Pulmonary/Chest: Effort normal and breath sounds normal. No respiratory distress. He has no wheezes.  Abdominal: Soft. Bowel sounds are normal. He exhibits no distension. There is no tenderness.  Musculoskeletal: Normal range of motion. He exhibits no edema or tenderness.  Neurological: He is alert and oriented to person, place, and time.  Skin: Skin is warm and dry. No rash noted. No erythema.  Psychiatric: He has a normal mood and affect. His behavior is normal. Judgment and thought content normal.  Vitals reviewed.    BP 129/84   Pulse 68   Temp (!) 96.8 F (36 C) (Oral)   Ht _0  (1.778 m)   Wt 223 lb 12.8 oz (101.5 kg)   BMI 32.11 kg/m      Assessment & Plan:  1. Essential hypertension - CMP14+EGFR  2. Gastroesophageal reflux disease, esophagitis presence not specified - CMP14+EGFR  3. Type 2 diabetes mellitus without complication, without long-term current use of insulin (HCC) - Bayer DCA Hb A1c Waived - CMP14+EGFR - Lipid panel - Microalbumin / creatinine urine ratio  4. Morbid obesity ([place]) - CMP14+EGFR   Continue all meds Labs pending Health  Maintenance reviewed Diet and exercise encouraged RTO 6 months   Evelina Dun, FNP

## 2017-04-17 LAB — MICROALBUMIN / CREATININE URINE RATIO
CREATININE, UR: 162.4 mg/dL
MICROALB/CREAT RATIO: 7.6 mg/g{creat} (ref 0.0–30.0)
Microalbumin, Urine: 12.3 ug/mL

## 2017-04-20 ENCOUNTER — Other Ambulatory Visit: Payer: Self-pay | Admitting: Family

## 2017-04-24 ENCOUNTER — Other Ambulatory Visit: Payer: Self-pay | Admitting: Family

## 2017-04-24 DIAGNOSIS — E119 Type 2 diabetes mellitus without complications: Secondary | ICD-10-CM

## 2017-04-24 DIAGNOSIS — K219 Gastro-esophageal reflux disease without esophagitis: Secondary | ICD-10-CM

## 2017-05-07 ENCOUNTER — Other Ambulatory Visit: Payer: Self-pay | Admitting: Family

## 2017-05-20 ENCOUNTER — Other Ambulatory Visit: Payer: Self-pay | Admitting: Family

## 2017-05-20 DIAGNOSIS — E119 Type 2 diabetes mellitus without complications: Secondary | ICD-10-CM

## 2017-05-20 DIAGNOSIS — Z794 Long term (current) use of insulin: Principal | ICD-10-CM

## 2017-06-05 ENCOUNTER — Encounter: Payer: Self-pay | Admitting: Family Medicine

## 2017-06-05 ENCOUNTER — Ambulatory Visit (INDEPENDENT_AMBULATORY_CARE_PROVIDER_SITE_OTHER): Payer: BC Managed Care – PPO | Admitting: Family Medicine

## 2017-06-05 VITALS — BP 138/88 | HR 77 | Temp 98.1°F | Ht 70.0 in | Wt 217.0 lb

## 2017-06-05 DIAGNOSIS — S90221A Contusion of right lesser toe(s) with damage to nail, initial encounter: Secondary | ICD-10-CM | POA: Diagnosis not present

## 2017-06-05 DIAGNOSIS — S90121A Contusion of right lesser toe(s) without damage to nail, initial encounter: Secondary | ICD-10-CM

## 2017-06-05 NOTE — Progress Notes (Signed)
Subjective: CC:toe injury PCP: Sharion Balloon, FNP Adrian Tucker is a 56 y.o. male presenting to clinic today for:  1. Toe injury Patient reports that about 3 weeks ago, he dropped sheetrock onto his right great toe. He notes some pain and discoloration that has now resolved. He has come to the clinic today because his wife was concerned about the "blood blisters" on the inferior aspect of the right great toe. He denies pain, swelling, fevers, chills, ulceration, purulence, decreased range of motion. He notes that essentially, he has felt his normal self except for discoloration on his toe.  No Known Allergies Past Medical History:  Diagnosis Date  . Arthritis   . Diabetes mellitus   . HTN (hypertension) 01/25/2015  . Hx of adenomatous colonic polyps 10/24/2015  . Recurrent upper respiratory infection (URI)    chest congestion 2-3 weeks ago- cough, no fever- states improved  . Seasonal allergies   . Shortness of breath 05/2015   Family History  Problem Relation Age of Onset  . Hypertension Father   . Cancer Paternal Uncle        esophageal cancers  . Colon cancer Neg Hx    Social Hx: non smoker.Current medications reviewed.    ROS: Per HPI  Objective: Office vital signs reviewed. BP 138/88   Pulse 77   Temp 98.1 F (36.7 C) (Oral)   Ht 5\' 10"  (1.778 m)   Wt 217 lb (98.4 kg)   BMI 31.14 kg/m   Physical Examination:  General: Awake, alert, well nourished, nontoxic appearing, No acute distress Extremities: warm, well perfused, No edema, cyanosis or clubbing; +2 pulses bilaterally  Right great toe: He does have a bony prominence at the PIP joint. This is nontender. Entire base of the nail bed appears black consistent with a healing hematoma. The inferior medial aspect of the right great toe demonstrates callus formation and several small healing hematomas. These are nontender to palpation; they are hard in texture. No palpable fluctuance or induration. No erythema.  No swelling. Patient has full active range of motion. MSK: normal gait and normal station Skin: dry; intact; no rashes or lesions Neuro: Light touch sensation grossly intact.  Assessment/ Plan: 56 y.o. male   1. Subungual hematoma of toenail of right foot, initial encounter Not causing pain. It appears that the nail has been partially avulsed. He seems to be resorbing the blood collection. Therefore no surgical intervention was performed today. He has no red flag signs or symptoms. No evidence of infection. Supportive care. Reassurance. Follow-up as needed.  2. Hematoma of toe, right, initial encounter Again, he has evidence of healing hematoma on the inferior aspect of the right great toe. This area is nontender and demonstrates no evidence of infection at this time. I reassured him that this should gradually get better. However, if it does not fully resolve, causes him pain, or shows any signs or symptoms of infection, he should return for reevaluation. He voiced good understanding. Handout was provided to him today. He will return as needed.   Orders Placed This Encounter  Procedures  . DG Foot Complete Right    Standing Status:   Future    Standing Expiration Date:   08/05/2018    Order Specific Question:   Reason for Exam (SYMPTOM  OR DIAGNOSIS REQUIRED)    Answer:   right toe pain/ injury    Order Specific Question:   Preferred imaging location?    Answer:   Internal  Order Specific Question:   Radiology Contrast Protocol - do NOT remove file path    Answer:   [email or web address]   No orders of the defined types were placed in this encounter.    Janora Norlander, DO [place] [phone]

## 2017-06-05 NOTE — Patient Instructions (Addendum)
The lesions on the underside of your right great toe looks to be calcifying hematoma. These are usually benign. If you notice ulceration, evidence infection (pus, redness, swelling, pain), please seek immediate medical attention. This is rare but can happen. Otherwise, he should expect that this resolves over the next several weeks. If for any reason it does not resolve, please return for reevaluation.  Subungual Hematoma A subungual hematoma, sometimes called runner's toe or tennis toe, is a collection of blood under a fingernail or toenail. What are the causes? This condition is caused by a crush injury to the finger or toe that breaks a blood vessel beneath the nail. It can develop after a hard, direct hit to a finger or toe, or after pressure is repeatedly put on an injured finger or toe. What are the signs or symptoms? Symptoms of this condition include:  A blue or dark blue color under the nail.  Pain or throbbing in the injured area.  How is this diagnosed? This condition is diagnosed with a medical history and a physical exam. How is this treated? Usually treatment is not needed for this condition. It usually goes away with time. If the condition is causing a lot of pain, or if a lot of blood collects under the fingernail or toenail, a health care provider may perform a painless procedure to drain the blood from beneath the nail. If there is a cut under the nail that requires stitches (sutures), the nail may be removed. Follow these instructions at home:  If directed, apply ice to the injured area: ? Put ice in a plastic bag. ? Place a towel between your skin and the bag. ? Leave the ice on for 20 minutes, 2-3 times per day.  Raise (elevate) the injured finger or toe above the level of your heart while you are sitting or lying down. This will help to decrease pain and swelling.  If part of your nail falls off, gently trim the remaining nail. This prevents the remaining nail from  catching on something and causing further injury.  Follow instructions from your health care provider about how to take care of your injury. Make sure you: ? Wash your hands with soap and water before you change your bandage (dressing). If soap and water are not available, use hand sanitizer. ? Change your dressing as told by your health care provider. ? Leave stitches (sutures) in place. You may have these if your health care provider repaired a cut under the nail. The sutures may need to stay in place for 2 weeks or longer.  Take over-the-counter and prescription medicines only as told by your health care provider.  Keep all follow-up visits. This is important. If you had a procedure to drain the blood from under your nail, follow up with your health care provider as told. Contact a health care provider if:  Your pain is not controlled with medicine.  You have redness, swelling, or pain around your nail. Get help right away if:  You have fluid, blood, or pus coming from your nail.  You have a fever. This information is not intended to replace advice given to you by your health care provider. Make sure you discuss any questions you have with your health care provider. Document Released: 08/22/2000 Document Revised: 04/23/2016 Document Reviewed: 01/10/2015 Elsevier Interactive Patient Education  2018 [employer].   Hematoma A hematoma is a collection of blood. The collection of blood can turn into a hard, painful lump under  the skin. Your skin may turn blue or yellow if the hematoma is close to the surface of the skin. Most hematomas get better in a few days to weeks. Some hematomas are serious and need medical care. Hematomas can be very small or very big. Follow these instructions at home:  Apply ice to the injured area: ? Put ice in a plastic bag. ? Place a towel between your skin and the bag. ? Leave the ice on for 20 minutes, 2-3 times a day for the first 1 to 2 days.  After  the first 2 days, switch to using warm packs on the injured area.  Raise (elevate) the injured area to lessen pain and puffiness (swelling). You may also wrap the area with an elastic bandage. Make sure the bandage is not wrapped too tight.  If you have a painful hematoma on your leg or foot, you may use crutches for a couple days.  Only take medicines as told by your doctor. Get help right away if:  Your pain gets worse.  Your pain is not controlled with medicine.  You have a fever.  Your puffiness gets worse.  Your skin turns more blue or yellow.  Your skin over the hematoma breaks or starts bleeding.  Your hematoma is in your chest or belly (abdomen) and you are short of breath, feel weak, or have a change in consciousness.  Your hematoma is on your scalp and you have a headache that gets worse or a change in alertness or consciousness. This information is not intended to replace advice given to you by your health care provider. Make sure you discuss any questions you have with your health care provider. Document Released: 10/02/2004 Document Revised: 01/31/2016 Document Reviewed: 02/02/2013 Elsevier Interactive Patient Education  2017 [employer].

## 2017-06-12 ENCOUNTER — Other Ambulatory Visit: Payer: Self-pay | Admitting: Family

## 2017-06-12 DIAGNOSIS — I10 Essential (primary) hypertension: Secondary | ICD-10-CM

## 2017-06-22 ENCOUNTER — Other Ambulatory Visit: Payer: Self-pay | Admitting: Family

## 2017-08-16 ENCOUNTER — Other Ambulatory Visit: Payer: Self-pay | Admitting: Family

## 2017-08-16 DIAGNOSIS — E119 Type 2 diabetes mellitus without complications: Secondary | ICD-10-CM

## 2017-08-16 DIAGNOSIS — Z794 Long term (current) use of insulin: Principal | ICD-10-CM

## 2017-10-16 ENCOUNTER — Ambulatory Visit: Payer: BC Managed Care – PPO | Admitting: Family

## 2017-10-16 ENCOUNTER — Encounter: Payer: Self-pay | Admitting: Family

## 2017-10-16 VITALS — BP 138/80 | HR 72 | Temp 97.1°F | Ht 70.0 in | Wt 225.0 lb

## 2017-10-16 DIAGNOSIS — I152 Hypertension secondary to endocrine disorders: Secondary | ICD-10-CM

## 2017-10-16 DIAGNOSIS — E1169 Type 2 diabetes mellitus with other specified complication: Secondary | ICD-10-CM

## 2017-10-16 DIAGNOSIS — K219 Gastro-esophageal reflux disease without esophagitis: Secondary | ICD-10-CM

## 2017-10-16 DIAGNOSIS — E66811 Obesity, class 1: Secondary | ICD-10-CM

## 2017-10-16 DIAGNOSIS — E119 Type 2 diabetes mellitus without complications: Secondary | ICD-10-CM | POA: Diagnosis not present

## 2017-10-16 DIAGNOSIS — I10 Essential (primary) hypertension: Secondary | ICD-10-CM | POA: Diagnosis not present

## 2017-10-16 DIAGNOSIS — E1159 Type 2 diabetes mellitus with other circulatory complications: Secondary | ICD-10-CM

## 2017-10-16 DIAGNOSIS — E785 Hyperlipidemia, unspecified: Secondary | ICD-10-CM

## 2017-10-16 DIAGNOSIS — E669 Obesity, unspecified: Secondary | ICD-10-CM | POA: Diagnosis not present

## 2017-10-16 LAB — BAYER DCA HB A1C WAIVED: HB A1C: 7.2 % — AB (ref <7.0)

## 2017-10-16 MED ORDER — ATORVASTATIN CALCIUM 10 MG PO TABS: 10.0000 mg | ORAL_TABLET | Freq: Every day | ORAL | 3 refills | Status: DC

## 2017-10-16 NOTE — Patient Instructions (Signed)
Diabetes Mellitus and Nutrition When you have diabetes (diabetes mellitus), it is very important to have healthy eating habits because your blood sugar (glucose) levels are greatly affected by what you eat and drink. Eating healthy foods in the appropriate amounts, at about the same times every day, can help you:  Control your blood glucose.  Lower your risk of heart disease.  Improve your blood pressure.  Reach or maintain a healthy weight.  Every person with diabetes is different, and each person has different needs for a meal plan. Your health care provider may recommend that you work with a diet and nutrition specialist (dietitian) to make a meal plan that is best for you. Your meal plan may vary depending on factors such as:  The calories you need.  The medicines you take.  Your weight.  Your blood glucose, blood pressure, and cholesterol levels.  Your activity level.  Other health conditions you have, such as heart or kidney disease.  How do carbohydrates affect me? Carbohydrates affect your blood glucose level more than any other type of food. Eating carbohydrates naturally increases the amount of glucose in your blood. Carbohydrate counting is a method for keeping track of how many carbohydrates you eat. Counting carbohydrates is important to keep your blood glucose at a healthy level, especially if you use insulin or take certain oral diabetes medicines. It is important to know how many carbohydrates you can safely have in each meal. This is different for every person. Your dietitian can help you calculate how many carbohydrates you should have at each meal and for snack. Foods that contain carbohydrates include:  Bread, cereal, rice, pasta, and crackers.  Potatoes and corn.  Peas, beans, and lentils.  Milk and yogurt.  Fruit and juice.  Desserts, such as cakes, cookies, ice cream, and candy.  How does alcohol affect me? Alcohol can cause a sudden decrease in blood  glucose (hypoglycemia), especially if you use insulin or take certain oral diabetes medicines. Hypoglycemia can be a life-threatening condition. Symptoms of hypoglycemia (sleepiness, dizziness, and confusion) are similar to symptoms of having too much alcohol. If your health care provider says that alcohol is safe for you, follow these guidelines:  Limit alcohol intake to no more than 1 drink per day for nonpregnant women and 2 drinks per day for men. One drink equals 12 oz of beer, 5 oz of wine, or 1 oz of hard liquor.  Do not drink on an empty stomach.  Keep yourself hydrated with water, diet soda, or unsweetened iced tea.  Keep in mind that regular soda, juice, and other mixers may contain a lot of sugar and must be counted as carbohydrates.  What are tips for following this plan? Reading food labels  Start by checking the serving size on the label. The amount of calories, carbohydrates, fats, and other nutrients listed on the label are based on one serving of the food. Many foods contain more than one serving per package.  Check the total grams (g) of carbohydrates in one serving. You can calculate the number of servings of carbohydrates in one serving by dividing the total carbohydrates by 15. For example, if a food has 30 g of total carbohydrates, it would be equal to 2 servings of carbohydrates.  Check the number of grams (g) of saturated and trans fats in one serving. Choose foods that have low or no amount of these fats.  Check the number of milligrams (mg) of sodium in one serving. Most people   should limit total sodium intake to less than 2,300 mg per day.  Always check the nutrition information of foods labeled as "low-fat" or "nonfat". These foods may be higher in added sugar or refined carbohydrates and should be avoided.  Talk to your dietitian to identify your daily goals for nutrients listed on the label. Shopping  Avoid buying canned, premade, or processed foods. These  foods tend to be high in fat, sodium, and added sugar.  Shop around the outside edge of the grocery store. This includes fresh fruits and vegetables, bulk grains, fresh meats, and fresh dairy. Cooking  Use low-heat cooking methods, such as baking, instead of high-heat cooking methods like deep frying.  Cook using healthy oils, such as olive, canola, or sunflower oil.  Avoid cooking with butter, cream, or high-fat meats. Meal planning  Eat meals and snacks regularly, preferably at the same times every day. Avoid going long periods of time without eating.  Eat foods high in fiber, such as fresh fruits, vegetables, beans, and whole grains. Talk to your dietitian about how many servings of carbohydrates you can eat at each meal.  Eat 4-6 ounces of lean protein each day, such as lean meat, chicken, fish, eggs, or tofu. 1 ounce is equal to 1 ounce of meat, chicken, or fish, 1 egg, or 1/4 cup of tofu.  Eat some foods each day that contain healthy fats, such as avocado, nuts, seeds, and fish. Lifestyle   Check your blood glucose regularly.  Exercise at least 30 minutes 5 or more days each week, or as told by your health care provider.  Take medicines as told by your health care provider.  Do not use any products that contain nicotine or tobacco, such as cigarettes and e-cigarettes. If you need help quitting, ask your health care provider.  Work with a counselor or diabetes educator to identify strategies to manage stress and any emotional and social challenges. What are some questions to ask my health care provider?  Do I need to meet with a diabetes educator?  Do I need to meet with a dietitian?  What number can I call if I have questions?  When are the best times to check my blood glucose? Where to find more information:  American Diabetes Association: diabetes.org/food-and-fitness/food  Academy of Nutrition and Dietetics:  www.eatright.org/resources/health/diseases-and-conditions/diabetes  National Institute of Diabetes and Digestive and Kidney Diseases (NIH): www.niddk.nih.gov/health-information/diabetes/overview/diet-eating-physical-activity Summary  A healthy meal plan will help you control your blood glucose and maintain a healthy lifestyle.  Working with a diet and nutrition specialist (dietitian) can help you make a meal plan that is best for you.  Keep in mind that carbohydrates and alcohol have immediate effects on your blood glucose levels. It is important to count carbohydrates and to use alcohol carefully. This information is not intended to replace advice given to you by your health care provider. Make sure you discuss any questions you have with your health care provider. Document Released: 05/22/2005 Document Revised: 09/29/2016 Document Reviewed: 09/29/2016 Elsevier Interactive Patient Education  2018 [employer] Inc[employer]  

## 2017-10-16 NOTE — Progress Notes (Signed)
Subjective:    Patient ID: Adrian Tucker, male    DOB: 06/08/1961, 57 y.o.   MRN: [id number]  PT presents to the office today for chronic follow up.  Diabetes  He presents for his follow-up diabetic visit. He has type 2 diabetes mellitus. His disease course has been stable. There are no hypoglycemic associated symptoms. There are no diabetic associated symptoms. Pertinent negatives for diabetes include no blurred vision, no foot paresthesias and no visual change. Symptoms are stable. Pertinent negatives for diabetic complications include no CVA, heart disease, nephropathy or peripheral neuropathy. Risk factors for coronary artery disease include dyslipidemia, diabetes mellitus, male sex, hypertension and sedentary lifestyle. His breakfast blood glucose range is generally 140-180 mg/dl. Eye exam is current.  Hypertension  This is a chronic problem. The current episode started more than 1 year ago. The problem has been resolved since onset. The problem is controlled. Pertinent negatives include no blurred vision, peripheral edema or shortness of breath. The current treatment provides moderate improvement. There is no history of kidney disease, CAD/MI or CVA.  Gastroesophageal Reflux  He reports no belching, no coughing or no heartburn. This is a chronic problem. The current episode started more than 1 year ago. The problem has been waxing and waning. The symptoms are aggravated by certain foods. He has tried a PPI for the symptoms. The treatment provided mild relief.  Hyperlipidemia  This is a chronic problem. The current episode started more than 1 year ago. The problem is controlled. Recent lipid tests were reviewed and are normal. Pertinent negatives include no shortness of breath. Current antihyperlipidemic treatment includes diet change. The current treatment provides no improvement of lipids. Risk factors for coronary artery disease include dyslipidemia, diabetes mellitus, male sex and a  sedentary lifestyle.      Review of Systems  Eyes: Negative for blurred vision.  Respiratory: Negative for cough and shortness of breath.   Gastrointestinal: Negative for heartburn.  All other systems reviewed and are negative.      Objective:   Physical Exam  Constitutional: He is oriented to person, place, and time. He appears well-developed and well-nourished. No distress.  HENT:  Head: Normocephalic.  Right Ear: External ear normal.  Left Ear: External ear normal.  Nose: Nose normal.  Mouth/Throat: Oropharynx is clear and moist.  Eyes: Pupils are equal, round, and reactive to light. Right eye exhibits no discharge. Left eye exhibits no discharge.  Neck: Normal range of motion. Neck supple. No thyromegaly present.  Cardiovascular: Normal rate, regular rhythm, normal heart sounds and intact distal pulses.  No murmur heard. Pulmonary/Chest: Effort normal and breath sounds normal. No respiratory distress. He has no wheezes.  Abdominal: Soft. Bowel sounds are normal. He exhibits no distension. There is no tenderness.  Musculoskeletal: Normal range of motion. He exhibits no edema or tenderness.  Neurological: He is alert and oriented to person, place, and time.  Skin: Skin is warm and dry. No rash noted. No erythema.  Psychiatric: He has a normal mood and affect. His behavior is normal. Judgment and thought content normal.  Vitals reviewed.   Diabetic Foot Exam - Simple   Simple Foot Form Diabetic Foot exam was performed with the following findings:  Yes 10/16/2017  8:35 AM  Visual Inspection No deformities, no ulcerations, no other skin breakdown bilaterally:  Yes Sensation Testing Intact to touch and monofilament testing bilaterally:  Yes Pulse Check Posterior Tibialis and Dorsalis pulse intact bilaterally:  Yes Comments  BP 138/80   Pulse 72   Temp (!) 97.1 F (36.2 C) (Oral)   Ht _0  (1.778 m)   Wt 225 lb (102.1 kg)   BMI 32.28 kg/m      Assessment &  Plan:  1. Type 2 diabetes mellitus without complication, without long-term current use of insulin (HCC) - Bayer DCA Hb A1c Waived - CMP14+EGFR  2. Gastroesophageal reflux disease, esophagitis presence not specified - CMP14+EGFR  3. Hypertension associated with diabetes ([place]) - CMP14+EGFR  4. Hyperlipidemia associated with type 2 diabetes mellitus ([place]) Pt started on Lipitor today - CMP14+EGFR - Lipid panel - atorvastatin (LIPITOR) 10 MG tablet; Take 1 tablet (10 mg total) by mouth daily.  Dispense: 90 tablet; Refill: 3  5. Obesity (BMI 30.0-34.9) - CMP14+EGFR   Continue all meds Labs pending Health Maintenance reviewed Diet and exercise encouraged RTO 4 months   Evelina Dun, FNP

## 2017-10-17 LAB — LIPID PANEL
CHOLESTEROL TOTAL: 125 mg/dL (ref 100–199)
Chol/HDL Ratio: 3.8 ratio (ref 0.0–5.0)
HDL: 33 mg/dL — ABNORMAL LOW (ref >39)
LDL Calculated: 57 mg/dL (ref 0–99)
TRIGLYCERIDES: 175 mg/dL — AB (ref 0–149)
VLDL Cholesterol Cal: 35 mg/dL (ref 5–40)

## 2017-10-17 LAB — CMP14+EGFR
A/G RATIO: 1.5 (ref 1.2–2.2)
ALK PHOS: 75 IU/L (ref 39–117)
ALT: 37 IU/L (ref 0–44)
AST: 26 IU/L (ref 0–40)
Albumin: 4.3 g/dL (ref 3.5–5.5)
BILIRUBIN TOTAL: 0.5 mg/dL (ref 0.0–1.2)
BUN/Creatinine Ratio: 25 — ABNORMAL HIGH (ref 9–20)
BUN: 34 mg/dL — ABNORMAL HIGH (ref 6–24)
CHLORIDE: 104 mmol/L (ref 96–106)
CO2: 21 mmol/L (ref 20–29)
Calcium: 9.5 mg/dL (ref 8.7–10.2)
Creatinine, Ser: 1.34 mg/dL — ABNORMAL HIGH (ref 0.76–1.27)
GFR calc Af Amer: 68 mL/min/{1.73_m2} (ref >59)
GFR calc non Af Amer: 59 mL/min/{1.73_m2} — ABNORMAL LOW (ref >59)
GLOBULIN, TOTAL: 2.9 g/dL (ref 1.5–4.5)
Glucose: 215 mg/dL — ABNORMAL HIGH (ref 65–99)
POTASSIUM: 5.4 mmol/L — AB (ref 3.5–5.2)
SODIUM: 140 mmol/L (ref 134–144)
Total Protein: 7.2 g/dL (ref 6.0–8.5)

## 2017-10-20 ENCOUNTER — Other Ambulatory Visit: Payer: Self-pay | Admitting: Family

## 2017-10-20 MED ORDER — EMPAGLIFLOZIN 10 MG PO TABS: 10.0000 mg | ORAL_TABLET | Freq: Every day | ORAL | 1 refills | Status: DC

## 2017-10-20 MED ORDER — AMLODIPINE BESYLATE 10 MG PO TABS: 10.0000 mg | ORAL_TABLET | Freq: Every day | ORAL | 11 refills | Status: DC

## 2017-10-20 MED ORDER — HYDROCHLOROTHIAZIDE 25 MG PO TABS: 25.0000 mg | ORAL_TABLET | Freq: Every day | ORAL | 1 refills | Status: DC

## 2017-11-04 ENCOUNTER — Other Ambulatory Visit: Payer: Self-pay | Admitting: Family

## 2017-11-04 DIAGNOSIS — E119 Type 2 diabetes mellitus without complications: Secondary | ICD-10-CM

## 2017-11-04 DIAGNOSIS — K219 Gastro-esophageal reflux disease without esophagitis: Secondary | ICD-10-CM

## 2017-11-05 ENCOUNTER — Other Ambulatory Visit: Payer: Self-pay | Admitting: Family

## 2017-12-23 ENCOUNTER — Other Ambulatory Visit: Payer: Self-pay | Admitting: Family

## 2017-12-23 DIAGNOSIS — I10 Essential (primary) hypertension: Secondary | ICD-10-CM

## 2017-12-29 ENCOUNTER — Other Ambulatory Visit: Payer: Self-pay | Admitting: Family

## 2017-12-29 DIAGNOSIS — E119 Type 2 diabetes mellitus without complications: Secondary | ICD-10-CM

## 2018-02-05 ENCOUNTER — Other Ambulatory Visit: Payer: Self-pay | Admitting: Family

## 2018-02-05 DIAGNOSIS — E119 Type 2 diabetes mellitus without complications: Secondary | ICD-10-CM

## 2018-02-08 NOTE — Telephone Encounter (Signed)
OV 02/19/18

## 2018-02-19 ENCOUNTER — Encounter: Payer: Self-pay | Admitting: Family

## 2018-02-19 ENCOUNTER — Ambulatory Visit: Payer: BC Managed Care – PPO | Admitting: Family

## 2018-02-19 VITALS — BP 138/84 | HR 50 | Temp 96.8°F | Ht 70.0 in | Wt 218.2 lb

## 2018-02-19 DIAGNOSIS — E669 Obesity, unspecified: Secondary | ICD-10-CM

## 2018-02-19 DIAGNOSIS — E1159 Type 2 diabetes mellitus with other circulatory complications: Secondary | ICD-10-CM

## 2018-02-19 DIAGNOSIS — E1169 Type 2 diabetes mellitus with other specified complication: Secondary | ICD-10-CM

## 2018-02-19 DIAGNOSIS — I10 Essential (primary) hypertension: Secondary | ICD-10-CM

## 2018-02-19 DIAGNOSIS — E785 Hyperlipidemia, unspecified: Secondary | ICD-10-CM

## 2018-02-19 DIAGNOSIS — K219 Gastro-esophageal reflux disease without esophagitis: Secondary | ICD-10-CM

## 2018-02-19 DIAGNOSIS — E119 Type 2 diabetes mellitus without complications: Secondary | ICD-10-CM

## 2018-02-19 DIAGNOSIS — Z0000 Encounter for general adult medical examination without abnormal findings: Secondary | ICD-10-CM | POA: Diagnosis not present

## 2018-02-19 LAB — BAYER DCA HB A1C WAIVED: HB A1C: 7.5 % — AB (ref <7.0)

## 2018-02-19 NOTE — Progress Notes (Signed)
Subjective:    Patient ID: Adrian Tucker, male    DOB: Mar 15, 1961, 57 y.o.   MRN: [id number]  Chief Complaint  Patient presents with  . Diabetes    four month recheck    Diabetes  He presents for his follow-up diabetic visit. He has type 2 diabetes mellitus. There are no hypoglycemic associated symptoms. Pertinent negatives for diabetes include no blurred vision and no foot paresthesias. Symptoms are stable. Pertinent negatives for diabetic complications include no CVA, heart disease, nephropathy or peripheral neuropathy. Risk factors for coronary artery disease include diabetes mellitus, dyslipidemia, male sex, hypertension and sedentary lifestyle. He is following a generally healthy diet. His overall blood glucose range is 130-140 mg/dl. Eye exam is current (06/18).  Hyperlipidemia  This is a chronic problem. The current episode started more than 1 year ago. The problem is controlled. Recent lipid tests were reviewed and are normal. Pertinent negatives include no shortness of breath. The current treatment provides moderate improvement of lipids.  Hypertension  This is a chronic problem. The current episode started more than 1 year ago. The problem has been resolved since onset. The problem is controlled. Associated symptoms include malaise/fatigue. Pertinent negatives include no blurred vision, peripheral edema or shortness of breath. There is no history of CVA.  Gastroesophageal Reflux  He reports no belching, no coughing or no heartburn. This is a chronic problem. The current episode started more than 1 year ago. The problem occurs occasionally. The problem has been resolved. The symptoms are aggravated by certain foods. He has tried an antacid for the symptoms. The treatment provided moderate relief.      Review of Systems  Constitutional: Positive for malaise/fatigue.  Eyes: Negative for blurred vision.  Respiratory: Negative for cough and shortness of breath.   Gastrointestinal:  Negative for heartburn.  All other systems reviewed and are negative.      Objective:   Physical Exam  Constitutional: He is oriented to person, place, and time. He appears well-developed and well-nourished. No distress.  HENT:  Head: Normocephalic.  Right Ear: External ear normal.  Left Ear: External ear normal.  Mouth/Throat: Oropharynx is clear and moist.  Eyes: Pupils are equal, round, and reactive to light. Right eye exhibits no discharge. Left eye exhibits no discharge.  Neck: Normal range of motion. Neck supple. No thyromegaly present.  Cardiovascular: Normal rate, regular rhythm, normal heart sounds and intact distal pulses.  No murmur heard. Pulmonary/Chest: Effort normal and breath sounds normal. No respiratory distress. He has no wheezes.  Abdominal: Soft. Bowel sounds are normal. He exhibits no distension. There is no tenderness.  Musculoskeletal: Normal range of motion. He exhibits no edema or tenderness.  Neurological: He is alert and oriented to person, place, and time. He has normal reflexes. No cranial nerve deficit.  Skin: Skin is warm and dry. No rash noted. No erythema.  Psychiatric: He has a normal mood and affect. His behavior is normal. Judgment and thought content normal.  Vitals reviewed.     BP 138/84   Pulse (!) 50   Temp (!) 96.8 F (36 C) (Oral)   Ht '5\' 10"'$  (1.778 m)   Wt 218 lb 3.2 oz (99 kg)   BMI 31.31 kg/m      Assessment & Plan:  Adrian Tucker comes in today with chief complaint of Diabetes (four month recheck) and Annual Exam   Diagnosis and orders addressed:  1. Annual physical exam - Bayer DCA Hb A1c Waived - CMP14+EGFR -  Lipid panel - TSH - PSA, total and free  2. Type 2 diabetes mellitus without complication, without long-term current use of insulin (HCC) - Bayer DCA Hb A1c Waived - CMP14+EGFR  3. Hypertension associated with diabetes ([place]) - CMP14+EGFR  4. Obesity (BMI 30.0-34.9) - CMP14+EGFR  5. Gastroesophageal  reflux disease, esophagitis presence not specified - CMP14+EGFR  6. Hyperlipidemia associated with type 2 diabetes mellitus ([place]) - CMP14+EGFR - Lipid panel   Labs pending Health Maintenance reviewed Diet and exercise encouraged  Follow up plan: 6 months    Evelina Dun, FNP

## 2018-02-19 NOTE — Patient Instructions (Signed)

## 2018-02-20 LAB — PSA, TOTAL AND FREE
PSA FREE PCT: 50 %
PSA, Free: 0.2 ng/mL
Prostate Specific Ag, Serum: 0.4 ng/mL (ref 0.0–4.0)

## 2018-02-20 LAB — CMP14+EGFR
ALBUMIN: 4 g/dL (ref 3.5–5.5)
ALT: 31 IU/L (ref 0–44)
AST: 28 IU/L (ref 0–40)
Albumin/Globulin Ratio: 1.4 (ref 1.2–2.2)
Alkaline Phosphatase: 74 IU/L (ref 39–117)
BUN/Creatinine Ratio: 18 (ref 9–20)
BUN: 19 mg/dL (ref 6–24)
Bilirubin Total: 0.5 mg/dL (ref 0.0–1.2)
CHLORIDE: 101 mmol/L (ref 96–106)
CO2: 22 mmol/L (ref 20–29)
Calcium: 9.7 mg/dL (ref 8.7–10.2)
Creatinine, Ser: 1.04 mg/dL (ref 0.76–1.27)
GFR, EST AFRICAN AMERICAN: 92 mL/min/{1.73_m2} (ref >59)
GFR, EST NON AFRICAN AMERICAN: 80 mL/min/{1.73_m2} (ref >59)
GLUCOSE: 198 mg/dL — AB (ref 65–99)
Globulin, Total: 2.8 g/dL (ref 1.5–4.5)
Potassium: 5.2 mmol/L (ref 3.5–5.2)
Sodium: 140 mmol/L (ref 134–144)
TOTAL PROTEIN: 6.8 g/dL (ref 6.0–8.5)

## 2018-02-20 LAB — LIPID PANEL
CHOL/HDL RATIO: 3.1 ratio (ref 0.0–5.0)
Cholesterol, Total: 114 mg/dL (ref 100–199)
HDL: 37 mg/dL — AB (ref >39)
LDL CALC: 53 mg/dL (ref 0–99)
Triglycerides: 120 mg/dL (ref 0–149)
VLDL CHOLESTEROL CAL: 24 mg/dL (ref 5–40)

## 2018-02-20 LAB — TSH: TSH: 2.29 u[IU]/mL (ref 0.450–4.500)

## 2018-03-23 ENCOUNTER — Other Ambulatory Visit: Payer: Self-pay | Admitting: Family

## 2018-03-23 DIAGNOSIS — Z794 Long term (current) use of insulin: Principal | ICD-10-CM

## 2018-03-23 DIAGNOSIS — E119 Type 2 diabetes mellitus without complications: Secondary | ICD-10-CM

## 2018-04-12 ENCOUNTER — Other Ambulatory Visit: Payer: Self-pay | Admitting: Family

## 2018-04-16 ENCOUNTER — Other Ambulatory Visit: Payer: Self-pay | Admitting: Family

## 2018-05-01 ENCOUNTER — Other Ambulatory Visit: Payer: Self-pay | Admitting: Family

## 2018-05-01 DIAGNOSIS — E119 Type 2 diabetes mellitus without complications: Secondary | ICD-10-CM

## 2018-05-01 DIAGNOSIS — K219 Gastro-esophageal reflux disease without esophagitis: Secondary | ICD-10-CM

## 2018-06-24 ENCOUNTER — Other Ambulatory Visit: Payer: Self-pay | Admitting: Family

## 2018-06-24 DIAGNOSIS — Z794 Long term (current) use of insulin: Principal | ICD-10-CM

## 2018-06-24 DIAGNOSIS — E119 Type 2 diabetes mellitus without complications: Secondary | ICD-10-CM

## 2018-07-17 ENCOUNTER — Other Ambulatory Visit: Payer: Self-pay | Admitting: Family

## 2018-08-03 ENCOUNTER — Other Ambulatory Visit: Payer: Self-pay | Admitting: Family

## 2018-08-03 DIAGNOSIS — E119 Type 2 diabetes mellitus without complications: Secondary | ICD-10-CM

## 2018-08-04 ENCOUNTER — Other Ambulatory Visit: Payer: Self-pay | Admitting: Family

## 2018-08-23 ENCOUNTER — Ambulatory Visit: Payer: BC Managed Care – PPO | Admitting: Family

## 2018-08-23 ENCOUNTER — Encounter: Payer: Self-pay | Admitting: Family

## 2018-08-23 VITALS — BP 129/84 | HR 68 | Temp 97.1°F | Ht 70.0 in | Wt 210.2 lb

## 2018-08-23 DIAGNOSIS — E1159 Type 2 diabetes mellitus with other circulatory complications: Secondary | ICD-10-CM

## 2018-08-23 DIAGNOSIS — E119 Type 2 diabetes mellitus without complications: Secondary | ICD-10-CM

## 2018-08-23 DIAGNOSIS — K219 Gastro-esophageal reflux disease without esophagitis: Secondary | ICD-10-CM

## 2018-08-23 DIAGNOSIS — E669 Obesity, unspecified: Secondary | ICD-10-CM | POA: Diagnosis not present

## 2018-08-23 DIAGNOSIS — E785 Hyperlipidemia, unspecified: Secondary | ICD-10-CM

## 2018-08-23 DIAGNOSIS — E1169 Type 2 diabetes mellitus with other specified complication: Secondary | ICD-10-CM

## 2018-08-23 DIAGNOSIS — I10 Essential (primary) hypertension: Secondary | ICD-10-CM

## 2018-08-23 LAB — BAYER DCA HB A1C WAIVED: HB A1C: 8.1 % — AB (ref <7.0)

## 2018-08-23 NOTE — Patient Instructions (Signed)

## 2018-08-23 NOTE — Progress Notes (Signed)
Subjective:    Patient ID: Adrian Tucker, male    DOB: 03/02/1961, 57 y.o.   MRN: [id number]  Chief Complaint  Patient presents with  . Medical Management of Chronic Issues  . Diabetes   PT presents to the office today for chronic follow up.  Diabetes  He presents for his follow-up diabetic visit. He has type 2 diabetes mellitus. His disease course has been stable. There are no hypoglycemic associated symptoms. There are no diabetic associated symptoms. Pertinent negatives for diabetes include no blurred vision and no foot paresthesias. There are no hypoglycemic complications. Symptoms are stable. Pertinent negatives for diabetic complications include no heart disease, nephropathy or peripheral neuropathy. Risk factors for coronary artery disease include diabetes mellitus, dyslipidemia, hypertension, male sex and stress. His overall blood glucose range is 110-130 mg/dl. Eye exam is current (will get copy faxed over and scanned into chart).  Hypertension  This is a chronic problem. The current episode started more than 1 year ago. The problem has been waxing and waning since onset. The problem is uncontrolled. Pertinent negatives include no blurred vision, peripheral edema or shortness of breath. Risk factors for coronary artery disease include dyslipidemia, diabetes mellitus and male gender. The current treatment provides moderate improvement.  Hyperlipidemia  This is a chronic problem. The current episode started more than 1 year ago. Pertinent negatives include no shortness of breath. Current antihyperlipidemic treatment includes statins. The current treatment provides moderate improvement of lipids. Risk factors for coronary artery disease include dyslipidemia, diabetes mellitus, hypertension and male sex.  Gastroesophageal Reflux  He complains of belching and heartburn. He reports no coughing. This is a chronic problem. The current episode started more than 1 year ago. The problem occurs  occasionally. The problem has been waxing and waning. The symptoms are aggravated by certain foods. He has tried a PPI for the symptoms. The treatment provided moderate relief.      Review of Systems  Eyes: Negative for blurred vision.  Respiratory: Negative for cough and shortness of breath.   Gastrointestinal: Positive for heartburn.  All other systems reviewed and are negative.      Objective:   Physical Exam Vitals signs reviewed.  Constitutional:      General: He is not in acute distress.    Appearance: He is well-developed.  HENT:     Head: Normocephalic.     Right Ear: Tympanic membrane normal.     Left Ear: Tympanic membrane normal.  Eyes:     General:        Right eye: No discharge.        Left eye: No discharge.     Pupils: Pupils are equal, round, and reactive to light.  Neck:     Musculoskeletal: Normal range of motion and neck supple.     Thyroid: No thyromegaly.  Cardiovascular:     Rate and Rhythm: Normal rate and regular rhythm.     Heart sounds: Normal heart sounds. No murmur.  Pulmonary:     Effort: Pulmonary effort is normal. No respiratory distress.     Breath sounds: Normal breath sounds. No wheezing.  Abdominal:     General: Bowel sounds are normal. There is no distension.     Palpations: Abdomen is soft.     Tenderness: There is no abdominal tenderness.  Musculoskeletal: Normal range of motion.        General: No tenderness.  Skin:    General: Skin is warm and dry.  Findings: No erythema or rash.  Neurological:     Mental Status: He is alert and oriented to person, place, and time.     Cranial Nerves: No cranial nerve deficit.     Deep Tendon Reflexes: Reflexes are normal and symmetric.  Psychiatric:        Behavior: Behavior normal.        Thought Content: Thought content normal.        Judgment: Judgment normal.      BP 129/84   Pulse 68   Temp (!) 97.1 F (36.2 C) (Oral)   Ht '5\' 10"'$  (1.778 m)   Wt 210 lb 3.2 oz (95.3 kg)    BMI 30.16 kg/m      Assessment & Plan:  AODHAN SCHEIDT comes in today with chief complaint of Medical Management of Chronic Issues and Diabetes   Diagnosis and orders addressed:  1. Hypertension associated with diabetes ([place]) - CMP14+EGFR  2. Gastroesophageal reflux disease, esophagitis presence not specified - CMP14+EGFR  3. Type 2 diabetes mellitus without complication, without long-term current use of insulin (HCC) - Bayer DCA Hb A1c Waived - CMP14+EGFR - Microalbumin / creatinine urine ratio  4. Obesity (BMI 30.0-34.9) - CMP14+EGFR  5. Hyperlipidemia associated with type 2 diabetes mellitus ([place]) - CMP14+EGFR - Lipid panel   Labs pending Health Maintenance reviewed Diet and exercise encouraged  Follow up plan: 6 months    Evelina Dun, FNP

## 2018-08-24 ENCOUNTER — Other Ambulatory Visit: Payer: Self-pay | Admitting: Family

## 2018-08-24 LAB — CMP14+EGFR
A/G RATIO: 1.5 (ref 1.2–2.2)
ALT: 35 IU/L (ref 0–44)
AST: 24 IU/L (ref 0–40)
Albumin: 4.6 g/dL (ref 3.5–5.5)
Alkaline Phosphatase: 88 IU/L (ref 39–117)
BILIRUBIN TOTAL: 0.8 mg/dL (ref 0.0–1.2)
BUN / CREAT RATIO: 20 (ref 9–20)
BUN: 28 mg/dL — ABNORMAL HIGH (ref 6–24)
CO2: 22 mmol/L (ref 20–29)
Calcium: 10.4 mg/dL — ABNORMAL HIGH (ref 8.7–10.2)
Chloride: 97 mmol/L (ref 96–106)
Creatinine, Ser: 1.42 mg/dL — ABNORMAL HIGH (ref 0.76–1.27)
GFR calc Af Amer: 63 mL/min/{1.73_m2} (ref >59)
GFR, EST NON AFRICAN AMERICAN: 54 mL/min/{1.73_m2} — AB (ref >59)
GLOBULIN, TOTAL: 3.1 g/dL (ref 1.5–4.5)
Glucose: 218 mg/dL — ABNORMAL HIGH (ref 65–99)
POTASSIUM: 4.9 mmol/L (ref 3.5–5.2)
SODIUM: 138 mmol/L (ref 134–144)
TOTAL PROTEIN: 7.7 g/dL (ref 6.0–8.5)

## 2018-08-24 LAB — LIPID PANEL
CHOL/HDL RATIO: 3.9 ratio (ref 0.0–5.0)
Cholesterol, Total: 139 mg/dL (ref 100–199)
HDL: 36 mg/dL — ABNORMAL LOW (ref >39)
LDL Calculated: 58 mg/dL (ref 0–99)
Triglycerides: 226 mg/dL — ABNORMAL HIGH (ref 0–149)
VLDL Cholesterol Cal: 45 mg/dL — ABNORMAL HIGH (ref 5–40)

## 2018-08-24 LAB — MICROALBUMIN / CREATININE URINE RATIO
Creatinine, Urine: 110.3 mg/dL
Microalb/Creat Ratio: 27.1 mg/g creat (ref 0.0–30.0)
Microalbumin, Urine: 29.9 ug/mL

## 2018-08-24 MED ORDER — EMPAGLIFLOZIN 25 MG PO TABS: 25.0000 mg | ORAL_TABLET | Freq: Every day | ORAL | 1 refills | Status: DC

## 2018-09-02 ENCOUNTER — Other Ambulatory Visit: Payer: Self-pay | Admitting: Family

## 2018-09-11 ENCOUNTER — Other Ambulatory Visit: Payer: Self-pay | Admitting: Family

## 2018-10-11 ENCOUNTER — Other Ambulatory Visit: Payer: Self-pay | Admitting: Family

## 2018-10-11 DIAGNOSIS — E785 Hyperlipidemia, unspecified: Principal | ICD-10-CM

## 2018-10-11 DIAGNOSIS — E1169 Type 2 diabetes mellitus with other specified complication: Secondary | ICD-10-CM

## 2018-10-15 ENCOUNTER — Ambulatory Visit: Payer: BC Managed Care – PPO | Admitting: Family

## 2018-10-15 ENCOUNTER — Encounter: Payer: Self-pay | Admitting: Family

## 2018-10-15 VITALS — BP 137/86 | HR 97 | Temp 101.2°F | Ht 70.0 in | Wt 209.6 lb

## 2018-10-15 DIAGNOSIS — J101 Influenza due to other identified influenza virus with other respiratory manifestations: Secondary | ICD-10-CM | POA: Diagnosis not present

## 2018-10-15 DIAGNOSIS — R6889 Other general symptoms and signs: Secondary | ICD-10-CM | POA: Diagnosis not present

## 2018-10-15 LAB — VERITOR FLU A/B WAIVED
Influenza A: POSITIVE — AB
Influenza B: NEGATIVE

## 2018-10-15 MED ORDER — OSELTAMIVIR PHOSPHATE 75 MG PO CAPS: 75.0000 mg | ORAL_CAPSULE | Freq: Two times a day (BID) | ORAL | 0 refills | Status: DC

## 2018-10-15 NOTE — Patient Instructions (Signed)

## 2018-10-15 NOTE — Progress Notes (Signed)
Subjective:    Patient ID: Adrian Tucker, male    DOB: 10-10-1960, 58 y.o.   MRN: [id number]  Chief Complaint  Patient presents with  . Cough  . Fever  . Nasal Congestion    Cough  This is a new problem. The current episode started yesterday. The problem has been gradually worsening. The problem occurs every few minutes. The cough is non-productive. Associated symptoms include chills, a fever, myalgias, nasal congestion, postnasal drip and wheezing. Pertinent negatives include no ear congestion, ear pain, headaches or sore throat. The symptoms are aggravated by lying down. He has tried rest for the symptoms. The treatment provided mild relief. There is no history of COPD.  Fever   Associated symptoms include coughing and wheezing. Pertinent negatives include no ear pain, headaches or sore throat.      Review of Systems  Constitutional: Positive for chills and fever.  HENT: Positive for postnasal drip. Negative for ear pain and sore throat.   Respiratory: Positive for cough and wheezing.   Musculoskeletal: Positive for myalgias.  Neurological: Negative for headaches.  All other systems reviewed and are negative.      Objective:   Physical Exam Vitals signs reviewed.  Constitutional:      General: He is not in acute distress.    Appearance: He is well-developed. He is ill-appearing.  HENT:     Head: Normocephalic.     Right Ear: External ear normal.     Left Ear: External ear normal.     Nose: Mucosal edema and rhinorrhea present.     Mouth/Throat:     Pharynx: Posterior oropharyngeal erythema present.  Eyes:     General:        Right eye: No discharge.        Left eye: No discharge.     Pupils: Pupils are equal, round, and reactive to light.  Neck:     Musculoskeletal: Normal range of motion and neck supple.     Thyroid: No thyromegaly.  Cardiovascular:     Rate and Rhythm: Normal rate and regular rhythm.     Heart sounds: Normal heart sounds. No murmur.    Pulmonary:     Effort: Pulmonary effort is normal. No respiratory distress.     Breath sounds: Normal breath sounds. No wheezing.  Abdominal:     General: Bowel sounds are normal. There is no distension.     Palpations: Abdomen is soft.     Tenderness: There is no abdominal tenderness.  Musculoskeletal: Normal range of motion.        General: No tenderness.  Skin:    General: Skin is warm and dry.     Findings: No erythema or rash.  Neurological:     Mental Status: He is alert and oriented to person, place, and time.     Cranial Nerves: No cranial nerve deficit.     Deep Tendon Reflexes: Reflexes are normal and symmetric.  Psychiatric:        Behavior: Behavior normal.        Thought Content: Thought content normal.        Judgment: Judgment normal.       BP 137/86   Pulse 97   Temp (!) 101.2 F (38.4 C) (Oral)   Ht 5\' 10"  (1.778 m)   Wt 209 lb 9.6 oz (95.1 kg)   BMI 30.07 kg/m      Assessment & Plan:  JOSHWA HEMRIC comes in today with chief complaint  of Cough; Fever; and Nasal Congestion   Diagnosis and orders addressed:  1. Flu-like symptoms - Veritor Flu A/B Waived  2. Influenza A Rest Force fluids Tylenol or motrin prn  Droplet precautions discussed RTo if symptoms worsen or do not improve  - oseltamivir (TAMIFLU) 75 MG capsule; Take 1 capsule (75 mg total) by mouth 2 (two) times daily.  Dispense: 10 capsule; Refill: 0   Evelina Dun, FNP

## 2018-11-02 ENCOUNTER — Other Ambulatory Visit: Payer: Self-pay | Admitting: Family

## 2018-11-02 DIAGNOSIS — E119 Type 2 diabetes mellitus without complications: Secondary | ICD-10-CM

## 2018-11-23 ENCOUNTER — Other Ambulatory Visit: Payer: Self-pay | Admitting: Family

## 2018-12-27 ENCOUNTER — Other Ambulatory Visit: Payer: Self-pay | Admitting: Family

## 2018-12-27 DIAGNOSIS — Z794 Long term (current) use of insulin: Principal | ICD-10-CM

## 2018-12-27 DIAGNOSIS — E119 Type 2 diabetes mellitus without complications: Secondary | ICD-10-CM

## 2019-01-08 ENCOUNTER — Other Ambulatory Visit: Payer: Self-pay | Admitting: Family

## 2019-01-08 DIAGNOSIS — L03031 Cellulitis of right toe: Secondary | ICD-10-CM | POA: Diagnosis not present

## 2019-01-08 DIAGNOSIS — E1169 Type 2 diabetes mellitus with other specified complication: Secondary | ICD-10-CM

## 2019-01-08 MED ORDER — CEPHALEXIN 500 MG PO CAPS: 500.0000 mg | ORAL_CAPSULE | Freq: Three times a day (TID) | ORAL | 0 refills | Status: DC

## 2019-01-08 NOTE — Progress Notes (Signed)
   Virtual Visit via telephone Note  I connected with Adrian Tucker on 01/08/19 at 9:08 AM by telephone and verified that I am speaking with the correct person using two identifiers. Adrian Tucker is currently located at home and wife is currently with her during visit. The provider, Evelina Dun, FNP is located in their office at time of visit.  I discussed the limitations, risks, security and privacy concerns of performing an evaluation and management service by telephone and the availability of in person appointments. I also discussed with the patient that there may be a patient responsible charge related to this service. The patient expressed understanding and agreed to proceed.   History and Present Illness:  HPI PT calls the office today for right great toe redness and swelling. He states he "scraped" it about 5 days and did not think anything of it. He states he put neosporin on it, but when he woke up this morning he noticed it was red, swollen, and warm. Denies any discharge, pain, or fevers.  He is a diabetic and his last A1C was 8.1.   Review of Systems  Skin:       Wound of right great toe   All other systems reviewed and are negative.    Observations/Objective: No SOB or distress noted  Assessment and Plan: 1. Cellulitis of great toe of right foot Will treat given his is a diabetic and symptoms are worsens Keep toe clean and dry Keep elevated  Report any increase in redness, fever, swelling, or discharge - cephALEXin (KEFLEX) 500 MG capsule; Take 1 capsule (500 mg total) by mouth 3 (three) times daily.  Dispense: 21 capsule; Refill: 0  2. Type 2 diabetes mellitus with other specified complication, without long-term current use of insulin (HCC) Continue all medications Strict low carb diet    I discussed the assessment and treatment plan with the patient. The patient was provided an opportunity to ask questions and all were answered. The patient agreed with  the plan and demonstrated an understanding of the instructions.   The patient was advised to call back or seek an in-person evaluation if the symptoms worsen or if the condition fails to improve as anticipated.  The above assessment and management plan was discussed with the patient. The patient verbalized understanding of and has agreed to the management plan. Patient is aware to call the clinic if symptoms persist or worsen. Patient is aware when to return to the clinic for a follow-up visit. Patient educated on when it is appropriate to go to the emergency department.   Time call ended:  9:17AM  I provided 9 minutes of non-face-to-face time during this encounter.    Evelina Dun, FNP

## 2019-02-01 ENCOUNTER — Other Ambulatory Visit: Payer: Self-pay | Admitting: Family

## 2019-02-01 DIAGNOSIS — E119 Type 2 diabetes mellitus without complications: Secondary | ICD-10-CM

## 2019-02-17 ENCOUNTER — Other Ambulatory Visit: Payer: Self-pay | Admitting: Family

## 2019-02-22 ENCOUNTER — Ambulatory Visit: Payer: BC Managed Care – PPO | Admitting: Family

## 2019-02-23 ENCOUNTER — Other Ambulatory Visit: Payer: Self-pay

## 2019-02-24 ENCOUNTER — Ambulatory Visit: Payer: BC Managed Care – PPO | Admitting: Family

## 2019-02-24 ENCOUNTER — Encounter: Payer: Self-pay | Admitting: Family

## 2019-02-24 VITALS — BP 138/79 | HR 76 | Temp 98.5°F | Ht 70.0 in | Wt 209.8 lb

## 2019-02-24 DIAGNOSIS — E1169 Type 2 diabetes mellitus with other specified complication: Secondary | ICD-10-CM

## 2019-02-24 DIAGNOSIS — E1159 Type 2 diabetes mellitus with other circulatory complications: Secondary | ICD-10-CM

## 2019-02-24 DIAGNOSIS — K219 Gastro-esophageal reflux disease without esophagitis: Secondary | ICD-10-CM

## 2019-02-24 DIAGNOSIS — W57XXXA Bitten or stung by nonvenomous insect and other nonvenomous arthropods, initial encounter: Secondary | ICD-10-CM

## 2019-02-24 DIAGNOSIS — E669 Obesity, unspecified: Secondary | ICD-10-CM | POA: Diagnosis not present

## 2019-02-24 DIAGNOSIS — I10 Essential (primary) hypertension: Secondary | ICD-10-CM

## 2019-02-24 DIAGNOSIS — I152 Hypertension secondary to endocrine disorders: Secondary | ICD-10-CM

## 2019-02-24 DIAGNOSIS — S70361A Insect bite (nonvenomous), right thigh, initial encounter: Secondary | ICD-10-CM

## 2019-02-24 DIAGNOSIS — E785 Hyperlipidemia, unspecified: Secondary | ICD-10-CM

## 2019-02-24 DIAGNOSIS — Z96642 Presence of left artificial hip joint: Secondary | ICD-10-CM

## 2019-02-24 LAB — BAYER DCA HB A1C WAIVED: HB A1C (BAYER DCA - WAIVED): 8.2 % — ABNORMAL HIGH (ref <7.0)

## 2019-02-24 NOTE — Progress Notes (Signed)
Subjective:    Patient ID: Adrian Tucker, male    DOB: 1961/02/23, 58 y.o.   MRN: [id number]  Chief Complaint  Patient presents with  . Medical Management of Chronic Issues      PT presents to the office today for chronic follow up. He states he removed a tick on his right thigh 2 weeks ago. States the area is still erythemas. Denies any headache, fever, joint pain, or fatigue.   Diabetes He presents for his follow-up diabetic visit. He has type 2 diabetes mellitus. His disease course has been stable. Pertinent negatives for diabetes include no blurred vision and no foot paresthesias. There are no hypoglycemic complications. Symptoms are stable. Pertinent negatives for diabetic complications include no CVA, heart disease, nephropathy or peripheral neuropathy. Risk factors for coronary artery disease include dyslipidemia, diabetes mellitus, male sex, hypertension and sedentary lifestyle. His overall blood glucose range is 110-130 mg/dl. Eye exam is current.  Hypertension This is a chronic problem. The current episode started more than 1 year ago. The problem has been resolved since onset. The problem is controlled. Pertinent negatives include no blurred vision, malaise/fatigue, peripheral edema or shortness of breath. The current treatment provides moderate improvement. There is no history of kidney disease, CAD/MI, CVA or heart failure.  Hyperlipidemia This is a chronic problem. The current episode started more than 1 year ago. Recent lipid tests were reviewed and are normal. Exacerbating diseases include diabetes and obesity. Pertinent negatives include no shortness of breath. Current antihyperlipidemic treatment includes statins. The current treatment provides moderate improvement of lipids. Risk factors for coronary artery disease include dyslipidemia, diabetes mellitus, male sex, hypertension and a sedentary lifestyle.  Gastroesophageal Reflux He complains of heartburn. He reports no  belching. This is a chronic problem. The current episode started more than 1 year ago. The problem occurs occasionally. He has tried a PPI for the symptoms. The treatment provided moderate relief.      Review of Systems  Constitutional: Negative for malaise/fatigue.  Eyes: Negative for blurred vision.  Respiratory: Negative for shortness of breath.   Gastrointestinal: Positive for heartburn.  All other systems reviewed and are negative.      Objective:   Physical Exam Vitals signs reviewed.  Constitutional:      General: He is not in acute distress.    Appearance: He is well-developed.  HENT:     Head: Normocephalic.     Right Ear: Tympanic membrane normal.     Left Ear: Tympanic membrane normal.  Eyes:     General:        Right eye: No discharge.        Left eye: No discharge.     Pupils: Pupils are equal, round, and reactive to light.  Neck:     Musculoskeletal: Normal range of motion and neck supple.     Thyroid: No thyromegaly.  Cardiovascular:     Rate and Rhythm: Normal rate and regular rhythm.     Heart sounds: Normal heart sounds. No murmur.  Pulmonary:     Effort: Pulmonary effort is normal. No respiratory distress.     Breath sounds: Normal breath sounds. No wheezing.  Abdominal:     General: Bowel sounds are normal. There is no distension.     Palpations: Abdomen is soft.     Tenderness: There is no abdominal tenderness.  Musculoskeletal: Normal range of motion.        General: No tenderness.  Skin:    General: Skin is  warm and dry.     Findings: No erythema or rash.     Comments: Right erythemas circle rash of 2.8X2.5 cm   Neurological:     Mental Status: He is alert and oriented to person, place, and time.     Cranial Nerves: No cranial nerve deficit.     Deep Tendon Reflexes: Reflexes are normal and symmetric.  Psychiatric:        Behavior: Behavior normal.        Thought Content: Thought content normal.        Judgment: Judgment normal.       Diabetic Foot Exam - Simple   Simple Foot Form Diabetic Foot exam was performed with the following findings: Yes 02/24/2019  2:41 PM  Visual Inspection No deformities, no ulcerations, no other skin breakdown bilaterally: Yes Sensation Testing Intact to touch and monofilament testing bilaterally: Yes Pulse Check Posterior Tibialis and Dorsalis pulse intact bilaterally: Yes Comments     BP 138/79   Pulse 76   Temp 98.5 F (36.9 C) (Oral)   Ht '5\' 10"'$  (1.778 m)   Wt 209 lb 12.8 oz (95.2 kg)   BMI 30.10 kg/m      Assessment & Plan:  Adrian Tucker comes in today with chief complaint of Medical Management of Chronic Issues   Diagnosis and orders addressed:  1. Hypertension associated with diabetes ([place]) - CMP14+EGFR - CBC with Differential/Platelet  2. Gastroesophageal reflux disease, esophagitis presence not specified - CMP14+EGFR - CBC with Differential/Platelet  3. Hyperlipidemia associated with type 2 diabetes mellitus ([place]) - CMP14+EGFR - CBC with Differential/Platelet - Lipid panel  4. Type 2 diabetes mellitus with other specified complication, without long-term current use of insulin (HCC) - CMP14+EGFR - CBC with Differential/Platelet - Bayer DCA Hb A1c Waived - Microalbumin / creatinine urine ratio  5. Obesity (BMI 30.0-34.9) - CMP14+EGFR - CBC with Differential/Platelet  6. History of left hip replacement - CMP14+EGFR - CBC with Differential/Platelet  7. Tick bite of right thigh, initial encounter -Pt to report any new fever, joint pain, or rash -Wear protective clothing while outside- Long sleeves and long pants -Put insect repellent on all exposed skin and along clothing -Take a shower as soon as possible after being outside RTO if symptoms worsen or do not improve  - Lyme Ab/Western Blot Reflex - [place] spotted fvr abs pnl(IgG+IgM)   Labs pending Health Maintenance reviewed Diet and exercise encouraged  Follow up plan: 3 months     Adrian Dun, FNP

## 2019-02-24 NOTE — Patient Instructions (Signed)

## 2019-02-25 ENCOUNTER — Other Ambulatory Visit: Payer: Self-pay | Admitting: Family

## 2019-02-25 LAB — MICROALBUMIN / CREATININE URINE RATIO
Creatinine, Urine: 42.9 mg/dL
Microalb/Creat Ratio: 25 mg/g creat (ref 0–29)
Microalbumin, Urine: 10.6 ug/mL

## 2019-02-25 MED ORDER — ROSUVASTATIN CALCIUM 20 MG PO TABS: 20.0000 mg | ORAL_TABLET | Freq: Every day | ORAL | 1 refills | Status: DC

## 2019-02-25 MED ORDER — TRESIBA FLEXTOUCH 100 UNIT/ML ~~LOC~~ SOPN: 5.0000 [IU] | PEN_INJECTOR | Freq: Every day | SUBCUTANEOUS | 1 refills | Status: DC

## 2019-02-26 ENCOUNTER — Other Ambulatory Visit: Payer: Self-pay | Admitting: Family

## 2019-02-26 LAB — CMP14+EGFR
ALT: 41 IU/L (ref 0–44)
AST: 47 IU/L — ABNORMAL HIGH (ref 0–40)
Albumin/Globulin Ratio: 1.5 (ref 1.2–2.2)
Albumin: 4.4 g/dL (ref 3.8–4.9)
Alkaline Phosphatase: 92 IU/L (ref 39–117)
BUN/Creatinine Ratio: 27 — ABNORMAL HIGH (ref 9–20)
BUN: 34 mg/dL — ABNORMAL HIGH (ref 6–24)
Bilirubin Total: 0.6 mg/dL (ref 0.0–1.2)
CO2: 20 mmol/L (ref 20–29)
Calcium: 9.3 mg/dL (ref 8.7–10.2)
Chloride: 97 mmol/L (ref 96–106)
Creatinine, Ser: 1.28 mg/dL — ABNORMAL HIGH (ref 0.76–1.27)
GFR calc Af Amer: 71 mL/min/{1.73_m2} (ref >59)
GFR calc non Af Amer: 62 mL/min/{1.73_m2} (ref >59)
Globulin, Total: 3 g/dL (ref 1.5–4.5)
Glucose: 284 mg/dL — ABNORMAL HIGH (ref 65–99)
Potassium: 4.2 mmol/L (ref 3.5–5.2)
Sodium: 136 mmol/L (ref 134–144)
Total Protein: 7.4 g/dL (ref 6.0–8.5)

## 2019-02-26 LAB — CBC WITH DIFFERENTIAL/PLATELET
Basophils Absolute: 0.1 10*3/uL (ref 0.0–0.2)
Basos: 1 %
EOS (ABSOLUTE): 0.4 10*3/uL (ref 0.0–0.4)
Eos: 5 %
Hematocrit: 45.4 % (ref 37.5–51.0)
Hemoglobin: 16.1 g/dL (ref 13.0–17.7)
Immature Grans (Abs): 0 10*3/uL (ref 0.0–0.1)
Immature Granulocytes: 0 %
Lymphocytes Absolute: 1.3 10*3/uL (ref 0.7–3.1)
Lymphs: 17 %
MCH: 31.8 pg (ref 26.6–33.0)
MCHC: 35.5 g/dL (ref 31.5–35.7)
MCV: 90 fL (ref 79–97)
Monocytes Absolute: 0.5 10*3/uL (ref 0.1–0.9)
Monocytes: 6 %
Neutrophils Absolute: 5.6 10*3/uL (ref 1.4–7.0)
Neutrophils: 71 %
Platelets: 207 10*3/uL (ref 150–450)
RBC: 5.06 x10E6/uL (ref 4.14–5.80)
RDW: 13 % (ref 11.6–15.4)
WBC: 7.8 10*3/uL (ref 3.4–10.8)

## 2019-02-26 LAB — ROCKY MTN SPOTTED FVR ABS PNL(IGG+IGM)
RMSF IgG: NEGATIVE
RMSF IgM: 0.33 index (ref 0.00–0.89)

## 2019-02-26 LAB — LIPID PANEL
Chol/HDL Ratio: 4.2 ratio (ref 0.0–5.0)
Cholesterol, Total: 131 mg/dL (ref 100–199)
HDL: 31 mg/dL — ABNORMAL LOW (ref >39)
Triglycerides: 437 mg/dL — ABNORMAL HIGH (ref 0–149)

## 2019-02-26 LAB — LYME AB/WESTERN BLOT REFLEX
LYME DISEASE AB, QUANT, IGM: <0.8 index (ref 0.00–0.79)
Lyme IgG/IgM Ab: <0.91 {ISR} (ref 0.00–0.90)

## 2019-03-01 ENCOUNTER — Other Ambulatory Visit: Payer: Self-pay | Admitting: *Deleted

## 2019-03-01 MED ORDER — PEN NEEDLES 30G X 5 MM MISC: 1.0000 "application " | Freq: Every day | 11 refills | Status: AC

## 2019-03-21 ENCOUNTER — Other Ambulatory Visit: Payer: Self-pay | Admitting: Family

## 2019-03-21 DIAGNOSIS — E119 Type 2 diabetes mellitus without complications: Secondary | ICD-10-CM

## 2019-03-24 ENCOUNTER — Other Ambulatory Visit: Payer: Self-pay | Admitting: Family

## 2019-04-20 ENCOUNTER — Other Ambulatory Visit: Payer: Self-pay | Admitting: Family

## 2019-05-12 ENCOUNTER — Other Ambulatory Visit: Payer: Self-pay | Admitting: Family

## 2019-05-12 DIAGNOSIS — E119 Type 2 diabetes mellitus without complications: Secondary | ICD-10-CM

## 2019-05-23 ENCOUNTER — Other Ambulatory Visit: Payer: Self-pay | Admitting: Family

## 2019-06-08 ENCOUNTER — Other Ambulatory Visit: Payer: Self-pay | Admitting: Family

## 2019-06-09 ENCOUNTER — Other Ambulatory Visit: Payer: Self-pay | Admitting: Family

## 2019-06-09 DIAGNOSIS — E119 Type 2 diabetes mellitus without complications: Secondary | ICD-10-CM

## 2019-07-06 ENCOUNTER — Other Ambulatory Visit: Payer: Self-pay | Admitting: Family

## 2019-07-06 DIAGNOSIS — E119 Type 2 diabetes mellitus without complications: Secondary | ICD-10-CM

## 2019-07-06 NOTE — Telephone Encounter (Signed)
OV 08/26/19

## 2019-08-04 ENCOUNTER — Other Ambulatory Visit: Payer: Self-pay | Admitting: Family

## 2019-08-04 DIAGNOSIS — E119 Type 2 diabetes mellitus without complications: Secondary | ICD-10-CM

## 2019-08-11 ENCOUNTER — Other Ambulatory Visit: Payer: Self-pay | Admitting: Family

## 2019-08-25 ENCOUNTER — Other Ambulatory Visit: Payer: Self-pay

## 2019-08-26 ENCOUNTER — Encounter: Payer: Self-pay | Admitting: Family

## 2019-08-26 ENCOUNTER — Other Ambulatory Visit: Payer: Self-pay | Admitting: Family

## 2019-08-26 ENCOUNTER — Ambulatory Visit: Payer: BC Managed Care – PPO | Admitting: Family

## 2019-08-26 VITALS — BP 124/79 | HR 66 | Temp 97.8°F | Ht 70.0 in | Wt 210.8 lb

## 2019-08-26 DIAGNOSIS — E1169 Type 2 diabetes mellitus with other specified complication: Secondary | ICD-10-CM

## 2019-08-26 DIAGNOSIS — K219 Gastro-esophageal reflux disease without esophagitis: Secondary | ICD-10-CM | POA: Diagnosis not present

## 2019-08-26 DIAGNOSIS — Z23 Encounter for immunization: Secondary | ICD-10-CM | POA: Diagnosis not present

## 2019-08-26 DIAGNOSIS — I10 Essential (primary) hypertension: Secondary | ICD-10-CM

## 2019-08-26 DIAGNOSIS — E1159 Type 2 diabetes mellitus with other circulatory complications: Secondary | ICD-10-CM

## 2019-08-26 DIAGNOSIS — E669 Obesity, unspecified: Secondary | ICD-10-CM | POA: Diagnosis not present

## 2019-08-26 DIAGNOSIS — I152 Hypertension secondary to endocrine disorders: Secondary | ICD-10-CM

## 2019-08-26 DIAGNOSIS — E785 Hyperlipidemia, unspecified: Secondary | ICD-10-CM

## 2019-08-26 LAB — BAYER DCA HB A1C WAIVED: HB A1C (BAYER DCA - WAIVED): 7.4 % — ABNORMAL HIGH (ref <7.0)

## 2019-08-26 NOTE — Progress Notes (Signed)
Subjective:    Patient ID: Adrian Tucker, male    DOB: August 01, 1961, 58 y.o.   MRN: [id number]  Chief Complaint  Patient presents with  . Medical Management of Chronic Issues    PT presents to the office today for chronic follow up.  Diabetes He presents for his follow-up diabetic visit. He has type 2 diabetes mellitus. His disease course has been stable. There are no hypoglycemic associated symptoms. Pertinent negatives for hypoglycemia include no headaches. There are no diabetic associated symptoms. There are no hypoglycemic complications. Symptoms are stable. Pertinent negatives for diabetic complications include no CVA, heart disease, nephropathy or peripheral neuropathy. Risk factors for coronary artery disease include dyslipidemia, diabetes mellitus, male sex and hypertension. He is following a generally healthy diet. His overall blood glucose range is 130-140 mg/dl.  Hypertension This is a chronic problem. The current episode started more than 1 year ago. The problem has been resolved since onset. The problem is controlled. Pertinent negatives include no anxiety, headaches, malaise/fatigue or peripheral edema. Risk factors for coronary artery disease include dyslipidemia, diabetes mellitus, obesity, male gender and sedentary lifestyle. The current treatment provides moderate improvement. There is no history of kidney disease or CVA.  Gastroesophageal Reflux He reports no belching or no heartburn. This is a chronic problem. The current episode started more than 1 year ago. The problem occurs occasionally. The problem has been waxing and waning. Risk factors include obesity. He has tried a PPI for the symptoms. The treatment provided moderate relief.      Review of Systems  Constitutional: Negative for malaise/fatigue.  Gastrointestinal: Negative for heartburn.  Neurological: Negative for headaches.  All other systems reviewed and are negative.      Objective:   Physical  Exam Vitals reviewed.  Constitutional:      General: He is not in acute distress.    Appearance: He is well-developed.  HENT:     Head: Normocephalic.     Right Ear: Tympanic membrane normal.     Left Ear: Tympanic membrane normal.  Eyes:     General:        Right eye: No discharge.        Left eye: No discharge.     Pupils: Pupils are equal, round, and reactive to light.  Neck:     Thyroid: No thyromegaly.  Cardiovascular:     Rate and Rhythm: Normal rate and regular rhythm.     Heart sounds: Normal heart sounds. No murmur.  Pulmonary:     Effort: Pulmonary effort is normal. No respiratory distress.     Breath sounds: Normal breath sounds. No wheezing.  Abdominal:     General: Bowel sounds are normal. There is no distension.     Palpations: Abdomen is soft.     Tenderness: There is no abdominal tenderness.  Musculoskeletal:        General: No tenderness. Normal range of motion.     Cervical back: Normal range of motion and neck supple.  Skin:    General: Skin is warm and dry.     Findings: No erythema or rash.  Neurological:     Mental Status: He is alert and oriented to person, place, and time.     Cranial Nerves: No cranial nerve deficit.     Deep Tendon Reflexes: Reflexes are normal and symmetric.  Psychiatric:        Behavior: Behavior normal.        Thought Content: Thought content normal.  Judgment: Judgment normal.       BP 124/79   Pulse 66   Temp 97.8 F (36.6 C) (Temporal)   Ht _0  (1.778 m)   Wt 210 lb 12.8 oz (95.6 kg)   SpO2 99%   BMI 30.25 kg/m      Assessment & Plan:  Adrian Tucker comes in today with chief complaint of Medical Management of Chronic Issues   Diagnosis and orders addressed:  1. Hypertension associated with diabetes ([place]) - CMP14+EGFR - CBC with Differential/Platelet  2. Gastroesophageal reflux disease, unspecified whether esophagitis present - CMP14+EGFR - CBC with Differential/Platelet  3. Hyperlipidemia  associated with type 2 diabetes mellitus (HCC) - CMP14+EGFR - CBC with Differential/Platelet  4. Type 2 diabetes mellitus with other specified complication, without long-term current use of insulin (HCC) - hgba1c - CMP14+EGFR - CBC with Differential/Platelet - Microalbumin / creatinine urine ratio  5. Obesity (BMI 30.0-34.9) - CMP14+EGFR - CBC with Differential/Platelet   Labs pending Health Maintenance reviewed Diet and exercise encouraged  Follow up plan: 6 months  Evelina Dun, FNP

## 2019-08-26 NOTE — Patient Instructions (Signed)

## 2019-08-26 NOTE — Addendum Note (Signed)
Addended by: Shelbie Ammons on: 08/26/2019 09:08 AM   Modules accepted: Orders

## 2019-08-27 LAB — CBC WITH DIFFERENTIAL/PLATELET
Basophils Absolute: 0.1 10*3/uL (ref 0.0–0.2)
Basos: 1 %
EOS (ABSOLUTE): 0.7 10*3/uL — ABNORMAL HIGH (ref 0.0–0.4)
Eos: 7 %
Hematocrit: 49.4 % (ref 37.5–51.0)
Hemoglobin: 16.7 g/dL (ref 13.0–17.7)
Immature Grans (Abs): 0 10*3/uL (ref 0.0–0.1)
Immature Granulocytes: 0 %
Lymphocytes Absolute: 1.7 10*3/uL (ref 0.7–3.1)
Lymphs: 19 %
MCH: 31.3 pg (ref 26.6–33.0)
MCHC: 33.8 g/dL (ref 31.5–35.7)
MCV: 93 fL (ref 79–97)
Monocytes Absolute: 0.5 10*3/uL (ref 0.1–0.9)
Monocytes: 6 %
Neutrophils Absolute: 5.9 10*3/uL (ref 1.4–7.0)
Neutrophils: 67 %
Platelets: 175 10*3/uL (ref 150–450)
RBC: 5.34 x10E6/uL (ref 4.14–5.80)
RDW: 13.2 % (ref 11.6–15.4)
WBC: 8.8 10*3/uL (ref 3.4–10.8)

## 2019-08-27 LAB — CMP14+EGFR
ALT: 39 IU/L (ref 0–44)
AST: 29 IU/L (ref 0–40)
Albumin/Globulin Ratio: 1.5 (ref 1.2–2.2)
Albumin: 4.5 g/dL (ref 3.8–4.9)
Alkaline Phosphatase: 75 IU/L (ref 39–117)
BUN/Creatinine Ratio: 23 — ABNORMAL HIGH (ref 9–20)
BUN: 28 mg/dL — ABNORMAL HIGH (ref 6–24)
Bilirubin Total: 0.6 mg/dL (ref 0.0–1.2)
CO2: 21 mmol/L (ref 20–29)
Calcium: 9.9 mg/dL (ref 8.7–10.2)
Chloride: 100 mmol/L (ref 96–106)
Creatinine, Ser: 1.23 mg/dL (ref 0.76–1.27)
GFR calc Af Amer: 74 mL/min/{1.73_m2} (ref >59)
GFR calc non Af Amer: 64 mL/min/{1.73_m2} (ref >59)
Globulin, Total: 3 g/dL (ref 1.5–4.5)
Glucose: 144 mg/dL — ABNORMAL HIGH (ref 65–99)
Potassium: 4.7 mmol/L (ref 3.5–5.2)
Sodium: 137 mmol/L (ref 134–144)
Total Protein: 7.5 g/dL (ref 6.0–8.5)

## 2019-08-27 LAB — MICROALBUMIN / CREATININE URINE RATIO
Creatinine, Urine: 58.3 mg/dL
Microalb/Creat Ratio: 32 mg/g creat — ABNORMAL HIGH (ref 0–29)
Microalbumin, Urine: 18.6 ug/mL

## 2019-09-04 ENCOUNTER — Other Ambulatory Visit: Payer: Self-pay | Admitting: Family

## 2019-09-04 DIAGNOSIS — E119 Type 2 diabetes mellitus without complications: Secondary | ICD-10-CM

## 2019-09-19 ENCOUNTER — Other Ambulatory Visit: Payer: Self-pay | Admitting: Family

## 2019-09-20 ENCOUNTER — Other Ambulatory Visit: Payer: Self-pay | Admitting: Family

## 2019-09-20 DIAGNOSIS — E119 Type 2 diabetes mellitus without complications: Secondary | ICD-10-CM

## 2019-09-20 DIAGNOSIS — Z794 Long term (current) use of insulin: Secondary | ICD-10-CM

## 2019-10-03 ENCOUNTER — Other Ambulatory Visit: Payer: Self-pay | Admitting: Family

## 2019-10-04 ENCOUNTER — Other Ambulatory Visit: Payer: Self-pay | Admitting: Family

## 2019-10-04 DIAGNOSIS — E119 Type 2 diabetes mellitus without complications: Secondary | ICD-10-CM

## 2019-10-23 ENCOUNTER — Other Ambulatory Visit: Payer: Self-pay | Admitting: Family

## 2019-11-04 ENCOUNTER — Other Ambulatory Visit: Payer: Self-pay | Admitting: Family

## 2019-12-26 ENCOUNTER — Other Ambulatory Visit: Payer: Self-pay | Admitting: Family

## 2020-01-01 ENCOUNTER — Other Ambulatory Visit: Payer: Self-pay | Admitting: Family

## 2020-01-01 DIAGNOSIS — E119 Type 2 diabetes mellitus without complications: Secondary | ICD-10-CM

## 2020-01-02 NOTE — Telephone Encounter (Signed)
08/26/19 rtc 6 mos

## 2020-01-04 ENCOUNTER — Telehealth: Payer: Self-pay | Admitting: Family

## 2020-01-04 ENCOUNTER — Telehealth: Payer: Self-pay | Admitting: *Deleted

## 2020-01-04 MED ORDER — TRESIBA FLEXTOUCH 100 UNIT/ML ~~LOC~~ SOPN: 5.0000 [IU] | PEN_INJECTOR | Freq: Every day | SUBCUTANEOUS | 1 refills | Status: DC

## 2020-01-04 NOTE — Telephone Encounter (Signed)
Fax from [facility] [place] comments: Pt requests new Rx for Adrian Tucker 100u/ml with new dosage in order for ins to pay, pt states they are out now, insurance will not cover until 01/09/20 Please advise

## 2020-01-04 NOTE — Telephone Encounter (Signed)
°  Prescription Request  01/04/2020  What is the name of the medication or equipment? Tyler Aas  Have you contacted your pharmacy to request a refill? (if applicable) Yes  Which pharmacy would you like this sent to? [facility]  Pt says he only has enough to last him a few more days.   Patient notified that their request is being sent to the clinical staff for review and that they should receive a response within 2 business days.

## 2020-01-04 NOTE — Telephone Encounter (Signed)
Med sent.

## 2020-01-05 NOTE — Addendum Note (Signed)
Addended by: Evelina Dun A on: 01/05/2020 03:38 PM   Modules accepted: Orders

## 2020-01-05 NOTE — Telephone Encounter (Signed)
Can we verify how many units of [place] he is using. Then please send rx. Thanks!

## 2020-01-05 NOTE — Telephone Encounter (Signed)
Rx responded to in previous encounter

## 2020-01-12 ENCOUNTER — Telehealth: Payer: Self-pay | Admitting: Family Medicine

## 2020-01-12 MED ORDER — TRESIBA FLEXTOUCH 100 UNIT/ML ~~LOC~~ SOPN: 25.0000 [IU] | PEN_INJECTOR | Freq: Every day | SUBCUTANEOUS | 1 refills | Status: DC

## 2020-01-12 NOTE — Telephone Encounter (Signed)
Patient aware, script is ready. 

## 2020-01-12 NOTE — Telephone Encounter (Signed)
Prescription sent to pharmacy.

## 2020-01-12 NOTE — Telephone Encounter (Signed)
Patient walked in he is upset because tresbia from last week got called in exactly the same and now he is out it needs to be called in for 25 unites for insurance to pay please resent to [facility]AP.

## 2020-01-12 NOTE — Addendum Note (Signed)
Addended by: Evelina Dun A on: 01/12/2020 08:30 AM   Modules accepted: Orders

## 2020-02-08 ENCOUNTER — Other Ambulatory Visit: Payer: Self-pay | Admitting: Family

## 2020-02-11 ENCOUNTER — Other Ambulatory Visit: Payer: Self-pay | Admitting: Family

## 2020-02-24 ENCOUNTER — Ambulatory Visit: Payer: BC Managed Care – PPO | Admitting: Family

## 2020-02-28 ENCOUNTER — Encounter: Payer: Self-pay | Admitting: Family

## 2020-03-22 ENCOUNTER — Other Ambulatory Visit: Payer: Self-pay | Admitting: Family

## 2020-03-22 DIAGNOSIS — Z794 Long term (current) use of insulin: Secondary | ICD-10-CM

## 2020-04-05 ENCOUNTER — Other Ambulatory Visit: Payer: Self-pay | Admitting: Family

## 2020-04-05 DIAGNOSIS — Z794 Long term (current) use of insulin: Secondary | ICD-10-CM

## 2020-04-06 ENCOUNTER — Ambulatory Visit
Admission: RE | Admit: 2020-04-06 | Discharge: 2020-04-06 | Disposition: A | Discharge status: Home / Self Care | Payer: BC Managed Care – PPO | Source: Ambulatory Visit | Attending: Family Medicine | Admitting: Family Medicine

## 2020-04-06 ENCOUNTER — Other Ambulatory Visit: Payer: Self-pay

## 2020-04-06 ENCOUNTER — Other Ambulatory Visit: Payer: Self-pay | Admitting: Family Medicine

## 2020-04-06 DIAGNOSIS — L97509 Non-pressure chronic ulcer of other part of unspecified foot with unspecified severity: Secondary | ICD-10-CM

## 2020-06-21 ENCOUNTER — Other Ambulatory Visit: Payer: Self-pay | Admitting: Family

## 2020-06-25 MED ORDER — BD PEN NEEDLE NANO 2ND GEN 32G X 4 MM MISC: 3 refills | Status: AC

## 2020-06-25 NOTE — Addendum Note (Signed)
Addended by: Antonietta Barcelona D on: 06/25/2020 08:23 AM   Modules accepted: Orders

## 2020-07-18 ENCOUNTER — Other Ambulatory Visit: Payer: Self-pay | Admitting: Family

## 2020-10-22 ENCOUNTER — Other Ambulatory Visit: Payer: Self-pay | Admitting: Family

## 2021-02-07 ENCOUNTER — Encounter: Payer: Self-pay | Admitting: Internal Medicine

## 2021-12-09 ENCOUNTER — Other Ambulatory Visit: Payer: Self-pay | Admitting: Family

## 2021-12-11 ENCOUNTER — Other Ambulatory Visit: Payer: Self-pay | Admitting: Family

## 2022-11-19 ENCOUNTER — Encounter: Payer: Self-pay | Admitting: Internal Medicine

## 2023-04-10 ENCOUNTER — Encounter: Payer: Self-pay | Admitting: Internal Medicine

## 2023-05-06 ENCOUNTER — Ambulatory Visit (AMBULATORY_SURGERY_CENTER): Payer: Self-pay

## 2023-05-06 ENCOUNTER — Encounter: Payer: Self-pay | Admitting: Internal Medicine

## 2023-05-06 ENCOUNTER — Other Ambulatory Visit: Payer: Self-pay

## 2023-05-06 VITALS — Ht 71.0 in | Wt 205.0 lb

## 2023-05-06 DIAGNOSIS — Z86010 Personal history of colonic polyps: Secondary | ICD-10-CM

## 2023-05-06 NOTE — Progress Notes (Signed)
Denies allergies to eggs or soy products. Denies complication of anesthesia or sedation. Denies use of weight loss medication. Denies use of O2.   Emmi instructions given for colonoscopy.  

## 2023-05-28 ENCOUNTER — Ambulatory Visit (AMBULATORY_SURGERY_CENTER): Payer: BC Managed Care – PPO | Admitting: Internal Medicine

## 2023-05-28 ENCOUNTER — Encounter: Payer: Self-pay | Admitting: Internal Medicine

## 2023-05-28 VITALS — BP 107/65 | HR 52 | Temp 97.7°F | Resp 13 | Ht 70.0 in | Wt 205.0 lb

## 2023-05-28 DIAGNOSIS — Z09 Encounter for follow-up examination after completed treatment for conditions other than malignant neoplasm: Secondary | ICD-10-CM

## 2023-05-28 DIAGNOSIS — D124 Benign neoplasm of descending colon: Secondary | ICD-10-CM

## 2023-05-28 DIAGNOSIS — Z86010 Personal history of colonic polyps: Secondary | ICD-10-CM | POA: Diagnosis not present

## 2023-05-28 MED ORDER — SODIUM CHLORIDE 0.9 % IV SOLN: 500.0000 mL | INTRAVENOUS | Status: DC

## 2023-05-28 NOTE — Patient Instructions (Addendum)
Handout provided about polyps.  Resume previous diet. Continue present medications. Repeat colonoscopy is recommended for surveillance.  The colonoscopy date will be determined after pathology results from today's exam become available.  YOU HAD AN ENDOSCOPIC PROCEDURE TODAY AT THE [facility]:   Refer to the procedure report that was given to you for any specific questions about what was found during the examination.  If the procedure report does not answer your questions, please call your gastroenterologist to clarify.  If you requested that your care partner not be given the details of your procedure findings, then the procedure report has been included in a sealed envelope for you to review at your convenience later.  YOU SHOULD EXPECT: Some feelings of bloating in the abdomen. Passage of more gas than usual.  Walking can help get rid of the air that was put into your GI tract during the procedure and reduce the bloating. If you had a lower endoscopy (such as a colonoscopy or flexible sigmoidoscopy) you may notice spotting of blood in your stool or on the toilet paper. If you underwent a bowel prep for your procedure, you may not have a normal bowel movement for a few days.  Please Note:  You might notice some irritation and congestion in your nose or some drainage.  This is from the oxygen used during your procedure.  There is no need for concern and it should clear up in a day or so.  SYMPTOMS TO REPORT IMMEDIATELY:  Following lower endoscopy (colonoscopy or flexible sigmoidoscopy):  Excessive amounts of blood in the stool  Significant tenderness or worsening of abdominal pains  Swelling of the abdomen that is new, acute  Fever of 100F or higher  For urgent or emergent issues, a gastroenterologist can be reached at any hour by calling [phone]. Do not use MyChart messaging for urgent concerns.    DIET:  We do recommend a small meal at first, but then you may proceed  to your regular diet.  Drink plenty of fluids but you should avoid alcoholic beverages for 24 hours.  ACTIVITY:  You should plan to take it easy for the rest of today and you should NOT DRIVE or use heavy machinery until tomorrow (because of the sedation medicines used during the test).    FOLLOW UP: Our staff will call the number listed on your records the next business day following your procedure.  We will call around 7:15- 8:00 am to check on you and address any questions or concerns that you may have regarding the information given to you following your procedure. If we do not reach you, we will leave a message.     If any biopsies were taken you will be contacted by phone or by letter within the next 1-3 weeks.  Please call us at [phone] if you have not heard about the biopsies in 3 weeks.    SIGNATURES/CONFIDENTIALITY: You and/or your care partner have signed paperwork which will be entered into your electronic medical record.  These signatures attest to the fact that that the information above on your After Visit Summary has been reviewed and is understood.  Full responsibility of the confidentiality of this discharge information lies with you and/or your care-partner.I found and removed one tiny polyp that looks benign.  I will let you know pathology results and when to have another routine colonoscopy by mail and/or My Chart.  I appreciate the opportunity to care for you. Iva Boop, MD,  FACG

## 2023-05-28 NOTE — Progress Notes (Signed)
Called to room to assist during endoscopic procedure.  Patient ID and intended procedure confirmed with present staff. Received instructions for my participation in the procedure from the performing physician.  

## 2023-05-28 NOTE — Progress Notes (Signed)
[facility] History and Physical   Primary Care Physician:  Junie Spencer, FNP   Reason for Procedure:    Encounter Diagnosis  Name Primary?   Personal history of colonic polyps Yes     Plan:    colonoscopy     HPI: Adrian Tucker is a 62 y.o. male for surveillance exam  2 subcm adenomas removed 2017 Past Medical History:  Diagnosis Date   Allergy    Arthritis    Diabetes mellitus    GERD (gastroesophageal reflux disease)    HTN (hypertension) 01/25/2015   Hx of adenomatous colonic polyps 10/24/2015   Recurrent upper respiratory infection (URI)    chest congestion 2-3 weeks ago- cough, no fever- states improved   Seasonal allergies    Shortness of breath 05/10/2015    Past Surgical History:  Procedure Laterality Date   KNEE ARTHROTOMY  09/09/2007   left   PILONIDAL CYST EXCISION  09/08/1973   Rotator cuff     TONSILLECTOMY     TOTAL HIP ARTHROPLASTY  11/28/2011   Procedure: TOTAL HIP ARTHROPLASTY ANTERIOR APPROACH;  Surgeon: Kathryne Hitch, MD;  Location: [facility];  Service: Orthopedics;  Laterality: Left;  Left Total Hip Arthroplasty, Direct Anterior Approach    Prior to Admission medications   Medication Sig Start Date End Date Taking? Authorizing Provider  amLODipine (NORVASC) 10 MG tablet TAKE 1 TABLET BY MOUTH EVERY DAY 09/19/19  Yes Hawks, Christy A, FNP  glimepiride (AMARYL) 4 MG tablet TAKE 1 TABLET BY MOUTH DAILY WITH BREAKFAST 01/02/20  Yes Hawks, Christy A, FNP  hydrochlorothiazide (HYDRODIURIL) 25 MG tablet TAKE 1 TABLET BY MOUTH EVERY DAY 02/13/20  Yes Hawks, Christy A, FNP  insulin degludec (TRESIBA FLEXTOUCH) 100 UNIT/ML FlexTouch Pen Inject 5 Units into the skin daily. (Needs to be seen before next refill) 07/18/20  Yes Hawks, Christy A, FNP  JARDIANCE 25 MG TABS tablet TAKE 1 TABLET BY MOUTH EVERY DAY 08/26/19  Yes Hawks, Christy A, FNP  metFORMIN (GLUCOPHAGE) 1000 MG tablet Take 1 tablet (1,000 mg total) by mouth 2 (two) times  daily with a meal. Needs to be seen before next refill 04/06/20  Yes Hawks, Christy A, FNP  omeprazole (PRILOSEC) 20 MG capsule TAKE 1 CAPSULE (20 MG TOTAL) BY MOUTH DAILY. 05/03/18  Yes Hawks, Christy A, FNP  polyethylene glycol powder (GLYCOLAX/MIRALAX) 17 GM/SCOOP powder Take 1 Container by mouth once.   Yes [provider]  rosuvastatin (CRESTOR) 20 MG tablet Take 1 tablet (20 mg total) by mouth daily. Needs to be seen for further refills. 02/08/20  Yes Jannifer Rodney A, FNP  valsartan-hydrochlorothiazide (DIOVAN-HCT) 160-25 MG tablet TAKE 1 TABLET EVERY DAY 12/23/17  Yes Hawks, Neysa Bonito A, FNP  Insulin Pen Needle (BD PEN NEEDLE NANO 2ND GEN) 32G X 4 MM MISC DAILY ADMINISTRATION OF TRESIBA DX E11.69 06/25/20   Jannifer Rodney A, FNP  Insulin Pen Needle (PEN NEEDLES) 30G X 5 MM MISC Inject 1 application into the skin daily. With [place]. 03/01/19   Junie Spencer, FNP  ONETOUCH VERIO test strip TEST ONCE DAILY 12/30/17   Jannifer Rodney A, FNP  Semaglutide (OZEMPIC, 0.25 OR 0.5 MG/DOSE, [place]) Inject 0.5 mg into the skin once a week.    [provider]    Current Outpatient Medications  Medication Sig Dispense Refill   amLODipine (NORVASC) 10 MG tablet TAKE 1 TABLET BY MOUTH EVERY DAY 90 tablet 1   glimepiride (AMARYL) 4 MG tablet TAKE 1 TABLET BY MOUTH  DAILY WITH BREAKFAST 90 tablet 0   hydrochlorothiazide (HYDRODIURIL) 25 MG tablet TAKE 1 TABLET BY MOUTH EVERY DAY 90 tablet 0   insulin degludec (TRESIBA FLEXTOUCH) 100 UNIT/ML FlexTouch Pen Inject 5 Units into the skin daily. (Needs to be seen before next refill) 3 mL 0   JARDIANCE 25 MG TABS tablet TAKE 1 TABLET BY MOUTH EVERY DAY 90 tablet 1   metFORMIN (GLUCOPHAGE) 1000 MG tablet Take 1 tablet (1,000 mg total) by mouth 2 (two) times daily with a meal. Needs to be seen before next refill 60 tablet 0   omeprazole (PRILOSEC) 20 MG capsule TAKE 1 CAPSULE (20 MG TOTAL) BY MOUTH DAILY. 90 capsule 0   polyethylene glycol powder  (GLYCOLAX/MIRALAX) 17 GM/SCOOP powder Take 1 Container by mouth once.     rosuvastatin (CRESTOR) 20 MG tablet Take 1 tablet (20 mg total) by mouth daily. Needs to be seen for further refills. 30 tablet 0   valsartan-hydrochlorothiazide (DIOVAN-HCT) 160-25 MG tablet TAKE 1 TABLET EVERY DAY 90 tablet 1   Insulin Pen Needle (BD PEN NEEDLE NANO 2ND GEN) 32G X 4 MM MISC DAILY ADMINISTRATION OF TRESIBA DX E11.69 100 each 3   Insulin Pen Needle (PEN NEEDLES) 30G X 5 MM MISC Inject 1 application into the skin daily. With [place]. 100 each 11   ONETOUCH VERIO test strip TEST ONCE DAILY 100 each 3   Semaglutide (OZEMPIC, 0.25 OR 0.5 MG/DOSE, [place]) Inject 0.5 mg into the skin once a week.     Current Facility-Administered Medications  Medication Dose Route Frequency Provider Last Rate Last Admin   0.9 %  sodium chloride infusion  500 mL Intravenous Continuous Iva Boop, MD        Allergies as of 05/28/2023   (No Known Allergies)    Family History  Problem Relation Age of Onset   Hypertension Father    Esophageal cancer Paternal Uncle    Cancer Paternal Uncle        esophageal cancers   Colon cancer Neg Hx    Rectal cancer Neg Hx    Stomach cancer Neg Hx     Social History   Socioeconomic History   Marital status: Married    Spouse name: Not on file   Number of children: Not on file   Years of education: Not on file   Highest education level: Not on file  Occupational History   Not on file  Tobacco Use   Smoking status: Never   Smokeless tobacco: Never  Vaping Use   Vaping status: Never Used  Substance and Sexual Activity   Alcohol use: Yes    Comment: rare socially   Drug use: No   Sexual activity: Yes  Other Topics Concern   Not on file  Social History Narrative   Not on file   Social Determinants of Health   Financial Resource Strain: Not on file  Food Insecurity: Not on file  Transportation Needs: Not on file  Physical Activity: Not on file  Stress: Not on file   Social Connections: Not on file  Intimate Partner Violence: Not on file    Review of Systems:  All other review of systems negative except as mentioned in the HPI.  Physical Exam: Vital signs BP 108/67   Pulse 65   Temp 97.7 F (36.5 C)   Ht 5\' 10"  (1.778 m)   Wt 205 lb (93 kg)   SpO2 96%   BMI 29.41 kg/m   General:  Alert,  Well-developed, well-nourished, pleasant and cooperative in NAD Lungs:  Clear throughout to auscultation.   Heart:  Regular rate and rhythm; no murmurs, clicks, rubs,  or gallops. Abdomen:  Soft, nontender and nondistended. Normal bowel sounds.   Neuro/Psych:  Alert and cooperative. Normal mood and affect. A and O x 3   @Nazarene Bunning  Sena Slate, MD, [facility] [phone] (pager) 05/28/2023 8:55 AM@

## 2023-05-28 NOTE — Op Note (Signed)
[facility] Patient Name: Adrian Tucker Procedure Date: 05/28/2023 8:49 AM MRN: [id number] Endoscopist: Iva Boop , MD, [id number] Age: 62 Referring MD:  Date of Birth: 02/14/1961 Gender: Male Account #: [id number] Procedure:                Colonoscopy Indications:              Surveillance: Personal history of adenomatous                            polyps on last colonoscopy > 5 years ago, Last                            colonoscopy: 2017 Medicines:                Monitored Anesthesia Care Procedure:                Pre-Anesthesia Assessment:                           - Prior to the procedure, a History and Physical                            was performed, and patient medications and                            allergies were reviewed. The patient's tolerance of                            previous anesthesia was also reviewed. The risks                            and benefits of the procedure and the sedation                            options and risks were discussed with the patient.                            All questions were answered, and informed consent                            was obtained. Prior Anticoagulants: The patient has                            taken no anticoagulant or antiplatelet agents. ASA                            Grade Assessment: III - A patient with severe                            systemic disease. After reviewing the risks and                            benefits, the patient was deemed in satisfactory  condition to undergo the procedure.                           After obtaining informed consent, the colonoscope                            was passed under direct vision. Throughout the                            procedure, the patient's blood pressure, pulse, and                            oxygen saturations were monitored continuously. The                            Olympus Scope [id number] was introduced  through the                            anus and advanced to the the cecum, identified by                            appendiceal orifice and ileocecal valve. The                            colonoscopy was performed without difficulty. The                            patient tolerated the procedure well. The quality                            of the bowel preparation was good. The ileocecal                            valve, appendiceal orifice, and rectum were                            photographed. Scope In: 9:02:37 AM Scope Out: 9:19:22 AM Scope Withdrawal Time: 0 hours 13 minutes 34 seconds  Total Procedure Duration: 0 hours 16 minutes 45 seconds  Findings:                 The perianal and digital rectal examinations were                            normal. Pertinent negatives include normal prostate                            (size, shape, and consistency).                           A diminutive polyp was found in the descending                            colon. The polyp was sessile. The polyp was removed  with a cold snare. Resection and retrieval were                            complete. Verification of patient identification                            for the specimen was done. Estimated blood loss was                            minimal.                           The exam was otherwise without abnormality on                            direct and retroflexion views. Complications:            No immediate complications. Estimated Blood Loss:     Estimated blood loss was minimal. Impression:               - One diminutive polyp in the descending colon,                            removed with a cold snare. Resected and retrieved.                           - The examination was otherwise normal on direct                            and retroflexion views.                           - Personal history of colonic polyps. 2 adenomas <                            1 cm  2017 Recommendation:           - Patient has a contact number available for                            emergencies. The signs and symptoms of potential                            delayed complications were discussed with the                            patient. Return to normal activities tomorrow.                            Written discharge instructions were provided to the                            patient.                           - Resume previous diet.                           -  Continue present medications.                           - Repeat colonoscopy is recommended for                            surveillance. The colonoscopy date will be                            determined after pathology results from today's                            exam become available for review. Iva Boop, MD 05/28/2023 9:30:08 AM This report has been signed electronically.

## 2023-05-28 NOTE — Progress Notes (Signed)
Vss nad trans to pacu 

## 2023-05-28 NOTE — Progress Notes (Signed)
Pt's states no medical or surgical changes since previsit or office visit. 

## 2023-06-01 ENCOUNTER — Telehealth: Payer: Self-pay | Admitting: *Deleted

## 2023-06-01 NOTE — Telephone Encounter (Signed)
  Follow up Call-     05/28/2023    7:58 AM  Call back number  Post procedure Call Back phone  # [phone]  Permission to leave phone message Yes     Patient questions:  Do you have a fever, pain , or abdominal swelling? No. Pain Score  0 *  Have you tolerated food without any problems? Yes.    Have you been able to return to your normal activities? Yes.    Do you have any questions about your discharge instructions: Diet   No. Medications  No. Follow up visit  No.  Do you have questions or concerns about your Care? No.  Actions: * If pain score is 4 or above: No action needed, pain <4.

## 2023-06-05 ENCOUNTER — Telehealth: Payer: Self-pay | Admitting: Internal Medicine

## 2023-06-05 NOTE — Telephone Encounter (Signed)
Dr. Luisa Hart called about this patient's polyp pathology from recent colonoscopy.   I removed a diminutive polyp and at this point it looks like a benign adenoma but there was a piece in the specimen consistent with adenocarcinoma.  The suspicion is that this is likely some sort of a contaminant from another specimen but at this point they do not really know.  They are checking their processes to see where this might of happened if possible what so far nothing has turned up.     Final pathology will be out on Monday, September 30 pending their review of their processes.

## 2023-06-10 NOTE — Telephone Encounter (Signed)
Please check on the issuing of final pathology report on this patient - thanks

## 2023-06-10 NOTE — Telephone Encounter (Signed)
[place] [facility] at [phone] was contacted and notified that Dr. Luisa Hart will be out of the office for the rest of the week.  Message was left for Dr. Oneita Kras to call back. Direct number provided

## 2023-06-11 NOTE — Telephone Encounter (Addendum)
I did speak to the patient today and explained what was going on and that we think there was a contaminant.  I was able to speak to Dr. Kenard Gower of pathology and he has let me know that Dr. Oneita Kras is running some additional testing DNA.  We will wait for the additional testing.  I updated the patient on this also.

## 2023-06-24 LAB — SURGICAL PATHOLOGY

## 2023-06-24 NOTE — Telephone Encounter (Signed)
Called patient regarding final pathology results.  He was not available I will try again tomorrow.

## 2023-06-26 ENCOUNTER — Encounter: Payer: Self-pay | Admitting: Internal Medicine

## 2023-06-26 NOTE — Telephone Encounter (Signed)
I spoke to Adrian Tucker and explained that we were unable to get DNA testing on stained material.  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  We cannot know that with certainty but that is our conclusion based upon all of the available evidence.   Will plan for a colonoscopy recall in 1 year please place that and I plan to discuss that with him at the time and I will also send a letter.

## 2023-10-26 ENCOUNTER — Ambulatory Visit: Payer: 59 | Admitting: Physician Assistant

## 2023-10-26 ENCOUNTER — Other Ambulatory Visit (INDEPENDENT_AMBULATORY_CARE_PROVIDER_SITE_OTHER): Payer: Self-pay

## 2023-10-26 DIAGNOSIS — M25552 Pain in left hip: Secondary | ICD-10-CM

## 2023-10-26 MED ORDER — METHYLPREDNISOLONE ACETATE 40 MG/ML IJ SUSP
40.0000 mg | INTRAMUSCULAR | Status: AC | PRN
Start: 2023-10-26 — End: 2023-10-26
  Administered 2023-10-26: 40 mg via INTRA_ARTICULAR

## 2023-10-26 MED ORDER — LIDOCAINE HCL 1 % IJ SOLN
3.0000 mL | INTRAMUSCULAR | Status: AC | PRN
Start: 2023-10-26 — End: 2023-10-26
  Administered 2023-10-26: 3 mL

## 2023-10-26 NOTE — Progress Notes (Signed)
 Office Visit Note   Patient: Adrian Tucker           Date of Birth: [birth year]           MRN: [id number] Visit Date: 10/26/2023              Requested by: Junie Spencer, FNP [address] [place],  [place] [address] PCP: Joycelyn Rua, MD   Assessment & Plan: Visit Diagnoses:  1. Pain in left hip     Plan: Patient on IT band stretching exercises.  Will have him follow-up with Dr. Magnus Ivan in 2 weeks see what type of response he had to the cortisone injection and the stretching exercises that were shown.  Questions were encouraged and answered at length.  Follow-Up Instructions: Return in about 2 weeks (around 11/09/2023) for Dr. Magnus Ivan.   Orders:  Orders Placed This Encounter  Procedures   Large Joint Inj   XR HIP UNILAT W OR W/O PELVIS 2-3 VIEWS LEFT   No orders of the defined types were placed in this encounter.     Procedures: Large Joint Inj: L greater trochanter on 10/26/2023 11:40 AM Indications: pain Details: 22 G 1.5 in needle, lateral approach  Arthrogram: No  Medications: 3 mL lidocaine 1 %; 40 mg methylPREDNISolone acetate 40 MG/ML Outcome: tolerated well, no immediate complications Procedure, treatment alternatives, risks and benefits explained, specific risks discussed. Consent was given by the patient. Immediately prior to procedure a time out was called to verify the correct patient, procedure, equipment, support staff and site/side marked as required. Patient was prepped and draped in the usual sterile fashion.       Clinical Data: No additional findings.   Subjective: Chief Complaint  Patient presents with   Left Hip - Pain    11/28/2011 LT THA with Dr. Magnus Ivan    HPI Mr. Andal 63 year old male who underwent a left total hip by Dr. Magnus Ivan 11/28/2011.  Did well until about 2 months ago.  He has developed some left buttocks and lateral hip pain.  No radicular symptoms no numbness tingling.  States he does have pain if he turns to  the suddenly lies on the left side or when doing stairs.  He denies any fevers or chills.  No redness about the incision.  Describes the pain as double to sharp pain at times.  He is diabetic and reports his last hemoglobin A1c to be low sevens.  Review of Systems Negative for fevers or chills.  Please see HPI otherwise negative or noncontributory.  Objective: Vital Signs: There were no vitals taken for this visit.  Physical Exam Constitutional:      Appearance: He is not ill-appearing or diaphoretic.  Pulmonary:     Effort: Pulmonary effort is normal.  Neurological:     Mental Status: He is alert and oriented to person, place, and time.  Psychiatric:        Mood and Affect: Mood normal.     Ortho Exam Right hip excellent range of motion without pain.  Left hip good external rotation.  Limited internal rotation with slight discomfort.  Right hip nontender over the trochanteric region.  Left hip tenderness over the troches region with palpation.  No discomfort with hip flexion bilaterally. Specialty Comments:  No specialty comments available.  Imaging: XR HIP UNILAT W OR W/O PELVIS 2-3 VIEWS LEFT Result Date: 10/26/2023 AP pelvis lateral view left hip: Bilateral hips well located.  Left total hip arthroplasty components well-seated.  No  signs of eccentric wear left hip liner.  No acute fractures.  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    PMFS History: Patient Active Problem List   Diagnosis Date Noted   Hyperlipidemia associated with type 2 diabetes mellitus (HCC) 10/16/2017   Hx of adenomatous colonic polyps 10/24/2015   GERD (gastroesophageal reflux disease) 09/04/2015   Obesity (BMI 30.0-34.9) 04/16/2015   Hypertension associated with diabetes (HCC) 01/25/2015   History of left hip replacement 11/28/2011   Diabetes mellitus (HCC) 02/22/2008   Past Medical History:  Diagnosis Date   Allergy    Arthritis    Diabetes mellitus    GERD (gastroesophageal  reflux disease)    HTN (hypertension) 01/25/2015   Hx of adenomatous colonic polyps 10/24/2015   Recurrent upper respiratory infection (URI)    chest congestion 2-3 weeks ago- cough, no fever- states improved   Seasonal allergies    Shortness of breath 05/10/2015    Family History  Problem Relation Age of Onset   Hypertension Father    Esophageal cancer Paternal Uncle    Cancer Paternal Uncle        esophageal cancers   Colon cancer Neg Hx    Rectal cancer Neg Hx    Stomach cancer Neg Hx     Past Surgical History:  Procedure Laterality Date   KNEE ARTHROTOMY  09/09/2007   left   PILONIDAL CYST EXCISION  09/08/1973   Rotator cuff     TONSILLECTOMY     TOTAL HIP ARTHROPLASTY  11/28/2011   Procedure: TOTAL HIP ARTHROPLASTY ANTERIOR APPROACH;  Surgeon: Kathryne Hitch, MD;  Location: [facility];  Service: Orthopedics;  Laterality: Left;  Left Total Hip Arthroplasty, Direct Anterior Approach   Social History   Occupational History   Not on file  Tobacco Use   Smoking status: Never   Smokeless tobacco: Never  Vaping Use   Vaping status: Never Used  Substance and Sexual Activity   Alcohol use: Yes    Comment: rare socially   Drug use: No   Sexual activity: Yes

## 2023-11-30 ENCOUNTER — Encounter: Payer: Self-pay | Admitting: Orthopaedic Surgery

## 2023-11-30 ENCOUNTER — Ambulatory Visit: Payer: 59 | Admitting: Orthopaedic Surgery

## 2023-11-30 DIAGNOSIS — M25552 Pain in left hip: Secondary | ICD-10-CM

## 2023-11-30 NOTE — Progress Notes (Signed)
 The patient is a 63 year old gentleman who is being seen in follow-up as a relates to recent episode of left hip pain.  We actually replaced the left hip back in 2013 and he done well until several weeks ago he developed some worsening left hip pain laterally.  It was concerning enough that he did come and appropriate for x-rays and exam.  It looks like his x-rays look pretty good and we were concerned about hip bursitis.  A steroid injection was placed around the lateral hip and we showed him stretching exercises.  He said the injection was very helpful and is just a little bit tender.  On exam his left operative hip moves smoothly and fluidly.  There are just some mild pain over the trochanteric area but no worrisome features.  Since he has had such a good response follow-up can be as needed.  He will work on stretching exercises and if things worsen he knows to come back and let us know.

## 2024-04-11 ENCOUNTER — Other Ambulatory Visit: Payer: Self-pay | Admitting: Medical Genetics

## 2024-04-12 ENCOUNTER — Other Ambulatory Visit

## 2024-04-12 DIAGNOSIS — Z006 Encounter for examination for normal comparison and control in clinical research program: Secondary | ICD-10-CM

## 2024-04-22 LAB — GENECONNECT MOLECULAR SCREEN: Genetic Analysis Overall Interpretation: NEGATIVE

## 2024-05-11 ENCOUNTER — Encounter: Payer: Self-pay | Admitting: Internal Medicine

## 2024-05-11 ENCOUNTER — Telehealth: Payer: Self-pay | Admitting: Internal Medicine

## 2024-05-11 NOTE — Telephone Encounter (Signed)
 Dr. Avram,  Patient has been scheduled for PV on 10/6 at 10:00 with colonoscopy on 10/24 at 8:30

## 2024-05-11 NOTE — Telephone Encounter (Addendum)
 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  The adenoma was diminutive.  The prevailing thought was this was probably a contaminant (the adenocarcinoma) but we decided to repeat a colonoscopy in 1 year.  I called and reviewed this with him today and he is agreeable with scheduling a repeat colonoscopy.  Patient needs previsit and colonoscopy in [facility] appointments

## 2024-06-13 ENCOUNTER — Encounter: Payer: Self-pay | Admitting: Internal Medicine

## 2024-06-13 ENCOUNTER — Ambulatory Visit

## 2024-06-13 DIAGNOSIS — Z860100 Personal history of colon polyps, unspecified: Secondary | ICD-10-CM

## 2024-06-13 NOTE — Progress Notes (Signed)
 No egg or soy allergy known to patient  No issues known to pt with past sedation with any surgeries or procedures Patient denies ever being told they had issues or difficulty with intubation  No FH of Malignant Hyperthermia Pt is not on diet pills Pt is not on  home 02  Pt is not on blood thinners  Pt denies issues with constipation  No A fib or A flutter Have any cardiac testing pending--no  LOA: independent Prep: spilt dose miralax   Patient's chart reviewed by Cathlyn Parsons CNRA prior to previsit and patient appropriate for the [facility].  Previsit completed and red dot placed by patient's name on their procedure day (on provider's schedule).     PV completed with patient. Prep instructions sent via mychart and home address.

## 2024-06-30 NOTE — Progress Notes (Unsigned)
 [facility] History and Physical   Primary Care Physician:  Nanci Senior, MD   Reason for Procedure:    Encounter Diagnosis  Name Primary?   Hx of adenomatous colonic polyps Yes  Also fragment of adenocarcinoma thought likely to be a contaminant associated with adenoma removed last year.   Plan:    Colonoscopy     HPI: Adrian Tucker is a 64 y.o. male with a history of colon polyps having had 2 subcentimeter adenomas removed in 2017 and then a diminutive adenoma removed in the descending colon last year.  Pathology specimen had a fragment of adenocarcinoma associated with it.  After discussion with pathology we thought that was most likely contaminant and decided to repeat a colonoscopy in 1 year   Past Medical History:  Diagnosis Date   Allergy    Arthritis    Diabetes mellitus    GERD (gastroesophageal reflux disease)    HTN (hypertension) 01/25/2015   Hx of adenomatous colonic polyps 10/24/2015   Recurrent upper respiratory infection (URI)    chest congestion 2-3 weeks ago- cough, no fever- states improved   Seasonal allergies    Shortness of breath 05/10/2015    Past Surgical History:  Procedure Laterality Date   KNEE ARTHROTOMY  09/09/2007   left   PILONIDAL CYST EXCISION  09/08/1973   Rotator cuff     TONSILLECTOMY     TOTAL HIP ARTHROPLASTY  11/28/2011   Procedure: TOTAL HIP ARTHROPLASTY ANTERIOR APPROACH;  Surgeon: Lonni CINDERELLA Poli, MD;  Location: [facility];  Service: Orthopedics;  Laterality: Left;  Left Total Hip Arthroplasty, Direct Anterior Approach     Current Outpatient Medications  Medication Sig Dispense Refill   amLODipine  (NORVASC ) 10 MG tablet TAKE 1 TABLET BY MOUTH EVERY DAY 90 tablet 1   glimepiride  (AMARYL ) 4 MG tablet TAKE 1 TABLET BY MOUTH DAILY WITH BREAKFAST 90 tablet 0   insulin  degludec (TRESIBA  FLEXTOUCH) 100 UNIT/ML FlexTouch Pen Inject 5 Units into the skin daily. (Needs to be seen before next refill) 3 mL 0    Insulin  Pen Needle (BD PEN NEEDLE NANO 2ND GEN) 32G X 4 MM MISC DAILY ADMINISTRATION OF TRESIBA  DX E11.69 100 each 3   Insulin  Pen Needle (PEN NEEDLES) 30G X 5 MM MISC Inject 1 application into the skin daily. With Tresiba . 100 each 11   JARDIANCE  25 MG TABS tablet TAKE 1 TABLET BY MOUTH EVERY DAY 90 tablet 1   lisinopril (ZESTRIL) 10 MG tablet Take 10 mg by mouth daily.     metFORMIN  (GLUCOPHAGE ) 1000 MG tablet Take 1 tablet (1,000 mg total) by mouth 2 (two) times daily with a meal. Needs to be seen before next refill 60 tablet 0   omeprazole  (PRILOSEC) 20 MG capsule TAKE 1 CAPSULE (20 MG TOTAL) BY MOUTH DAILY. 90 capsule 0   polyethylene glycol powder (GLYCOLAX/MIRALAX) 17 GM/SCOOP powder Take 1 Container by mouth once. (Patient not taking: Reported on 06/13/2024)     rosuvastatin  (CRESTOR ) 20 MG tablet Take 1 tablet (20 mg total) by mouth daily. Needs to be seen for further refills. 30 tablet 0   Semaglutide (OZEMPIC, 0.25 OR 0.5 MG/DOSE, Victoria) Inject 0.5 mg into the skin once a week.     No current facility-administered medications for this visit.    Allergies as of 07/01/2024   (No Known Allergies)    Family History  Problem Relation Age of Onset   Hypertension Father    Esophageal cancer Paternal Uncle  Cancer Paternal Uncle        esophageal cancers   Colon cancer Neg Hx    Rectal cancer Neg Hx    Stomach cancer Neg Hx     Social History   Socioeconomic History   Marital status: Married    Spouse name: Not on file   Number of children: Not on file   Years of education: Not on file   Highest education level: Not on file  Occupational History   Not on file  Tobacco Use   Smoking status: Never   Smokeless tobacco: Never  Vaping Use   Vaping status: Never Used  Substance and Sexual Activity   Alcohol use: Yes    Comment: rare socially   Drug use: No   Sexual activity: Yes  Other Topics Concern   Not on file  Social History Narrative   Not on file   Social  Drivers of Health   Financial Resource Strain: Not on file  Food Insecurity: Not on file  Transportation Needs: Not on file  Physical Activity: Not on file  Stress: Not on file  Social Connections: Not on file  Intimate Partner Violence: Not on file    Review of Systems: Positive for *** All other review of systems negative except as mentioned in the HPI.  Physical Exam: Vital signs There were no vitals taken for this visit.  General:   Alert,  Well-developed, well-nourished, pleasant and cooperative in NAD Lungs:  Clear throughout to auscultation.   Heart:  Regular rate and rhythm; no murmurs, clicks, rubs,  or gallops. Abdomen:  Soft, nontender and nondistended. Normal bowel sounds.   Neuro/Psych:  Alert and cooperative. Normal mood and affect. A and O x 3   @Yordin Rhoda  CHARLENA Commander, MD, [facility] [phone] (pager) 06/30/2024 9:09 PM@

## 2024-07-01 ENCOUNTER — Ambulatory Visit: Admitting: Internal Medicine

## 2024-07-01 ENCOUNTER — Encounter: Payer: Self-pay | Admitting: Internal Medicine

## 2024-07-01 VITALS — BP 114/74 | HR 66 | Temp 98.4°F | Resp 12 | Ht 70.0 in | Wt 205.0 lb

## 2024-07-01 DIAGNOSIS — Z860101 Personal history of adenomatous and serrated colon polyps: Secondary | ICD-10-CM

## 2024-07-01 MED ORDER — SODIUM CHLORIDE 0.9 % IV SOLN: 500.0000 mL | Freq: Once | INTRAVENOUS | Status: DC

## 2024-07-01 NOTE — Progress Notes (Signed)
 To PACU via stretcher, sedated, good respiratory effort, VSS.

## 2024-07-01 NOTE — Progress Notes (Signed)
 Pt's states no medical or surgical changes since previsit or office visit.

## 2024-07-01 NOTE — Patient Instructions (Addendum)
 No polyps no cancer seen.  Do have some diverticulosis, a common condition that is not typically a problem.  Please read the handout.  I recommend a repeat colonoscopy in 5 years.  I appreciate the opportunity to care for you. Lupita CHARLENA Commander, MD, FACG  YOU HAD AN ENDOSCOPIC PROCEDURE TODAY AT THE [facility]:   Refer to the procedure report that was given to you for any specific questions about what was found during the examination.  If the procedure report does not answer your questions, please call your gastroenterologist to clarify.  If you requested that your care partner not be given the details of your procedure findings, then the procedure report has been included in a sealed envelope for you to review at your convenience later.  YOU SHOULD EXPECT: Some feelings of bloating in the abdomen. Passage of more gas than usual.  Walking can help get rid of the air that was put into your GI tract during the procedure and reduce the bloating. If you had a lower endoscopy (such as a colonoscopy or flexible sigmoidoscopy) you may notice spotting of blood in your stool or on the toilet paper. If you underwent a bowel prep for your procedure, you may not have a normal bowel movement for a few days.  Please Note:  You might notice some irritation and congestion in your nose or some drainage.  This is from the oxygen used during your procedure.  There is no need for concern and it should clear up in a day or so.  SYMPTOMS TO REPORT IMMEDIATELY:  Following lower endoscopy (colonoscopy or flexible sigmoidoscopy):  Excessive amounts of blood in the stool  Significant tenderness or worsening of abdominal pains  Swelling of the abdomen that is new, acute  Fever of 100F or higher   For urgent or emergent issues, a gastroenterologist can be reached at any hour by calling [phone]. Do not use MyChart messaging for urgent concerns.    DIET:  We do recommend a small meal at first, but  then you may proceed to your regular diet.  Drink plenty of fluids but you should avoid alcoholic beverages for 24 hours.  ACTIVITY:  You should plan to take it easy for the rest of today and you should NOT DRIVE or use heavy machinery until tomorrow (because of the sedation medicines used during the test).    FOLLOW UP: Our staff will call the number listed on your records the next business day following your procedure.  We will call around 7:15- 8:00 am to check on you and address any questions or concerns that you may have regarding the information given to you following your procedure. If we do not reach you, we will leave a message.     If any biopsies were taken you will be contacted by phone or by letter within the next 1-3 weeks.  Please call us  at [phone] if you have not heard about the biopsies in 3 weeks.    SIGNATURES/CONFIDENTIALITY: You and/or your care partner have signed paperwork which will be entered into your electronic medical record.  These signatures attest to the fact that that the information above on your After Visit Summary has been reviewed and is understood.  Full responsibility of the confidentiality of this discharge information lies with you and/or your care-partner.

## 2024-07-01 NOTE — Op Note (Signed)
 [facility] Patient Name: Adrian Tucker Procedure Date: 07/01/2024 9:37 AM MRN: [id number] Endoscopist: Lupita FORBES Commander , MD, [id number] Age: 63 Referring MD:  Date of Birth: June 02, 1961 Gender: Male Account #: [id number] Procedure:                Colonoscopy Indications:              High risk colon cancer surveillance: Personal                            history of colonic polyps, Last colonoscopy: 2024 -                            had 5 mm descending adenoma removed and specimen                            had a fragmnent of adenocarcinoma present most                            likely a contaminant per pathology Medicines:                Monitored Anesthesia Care Procedure:                Pre-Anesthesia Assessment:                           - Prior to the procedure, a History and Physical                            was performed, and patient medications and                            allergies were reviewed. The patient's tolerance of                            previous anesthesia was also reviewed. The risks                            and benefits of the procedure and the sedation                            options and risks were discussed with the patient.                            All questions were answered, and informed consent                            was obtained. Prior Anticoagulants: The patient has                            taken no anticoagulant or antiplatelet agents. ASA                            Grade Assessment: II - A patient with mild systemic  disease. After reviewing the risks and benefits,                            the patient was deemed in satisfactory condition to                            undergo the procedure.                           After obtaining informed consent, the colonoscope                            was passed under direct vision. Throughout the                            procedure, the patient's blood  pressure, pulse, and                            oxygen saturations were monitored continuously. The                            CF [id number] #[id number] was introduced through the anus                            and advanced to the the cecum, identified by                            appendiceal orifice and ileocecal valve. The                            ileocecal valve, appendiceal orifice, and rectum                            were photographed. The colonoscopy was performed                            without difficulty. The patient tolerated the                            procedure well. The quality of the bowel                            preparation was good. The ileocecal valve,                            appendiceal orifice, and rectum were photographed.                            The bowel preparation used was Miralax via split                            dose instruction. Scope In: 9:55:25 AM Scope Out: 10:10:04 AM Scope Withdrawal Time: 0 hours 10 minutes 7 seconds  Total Procedure Duration: 0 hours 14 minutes 39 seconds  Findings:                 The perianal and digital rectal examinations were                            normal. Pertinent negatives include normal prostate                            (size, shape, and consistency).                           A few small-mouthed diverticula were found in the                            sigmoid colon and descending colon.                           The exam was otherwise without abnormality on                            direct and retroflexion views. Complications:            No immediate complications. Estimated Blood Loss:     Estimated blood loss: none. Impression:               - Diverticulosis in the sigmoid colon and in the                            descending colon.                           - The examination was otherwise normal on direct                            and retroflexion views.                           - No specimens  collected.                           - Personal history of colonic polyps. 2 sub                            centimeter adenomas 2017, 5 mm adenoma 2024 and in                            that specoimen was a fragment of adenocarcinoma                            suspected to be contaminant and that is my current                            conclusion after today's exam. Recommendation:           - Patient has a contact number available for  emergencies. The signs and symptoms of potential                            delayed complications were discussed with the                            patient. Return to normal activities tomorrow.                            Written discharge instructions were provided to the                            patient.                           - Resume previous diet.                           - Continue present medications.                           - Repeat colonoscopy in 5 years for surveillance. Lupita FORBES Commander, MD 07/01/2024 10:18:52 AM This report has been signed electronically.

## 2024-07-04 ENCOUNTER — Telehealth: Payer: Self-pay

## 2024-07-04 NOTE — Telephone Encounter (Signed)
  Follow up Call-     07/01/2024    8:57 AM 05/28/2023    7:58 AM  Call back number  Post procedure Call Back phone  # [phone]  Permission to leave phone message Yes Yes     Patient questions:  Do you have a fever, pain , or abdominal swelling? No. Pain Score  0 *  Have you tolerated food without any problems? Yes.    Have you been able to return to your normal activities? Yes.    Do you have any questions about your discharge instructions: Diet   No. Medications  No. Follow up visit  No.  Do you have questions or concerns about your Care? No.  Actions: * If pain score is 4 or above: No action needed, pain <4.

## 2024-07-05 NOTE — Addendum Note (Signed)
 Addended by: AVRAM PITTS E on: 07/05/2024 12:21 PM   Modules accepted: Level of Service

## 2024-07-11 ENCOUNTER — Encounter: Payer: Self-pay | Admitting: Radiology
